# Patient Record
Sex: Female | Born: 1945
Health system: Southern US, Community
[De-identification: ages and names within clinical notes are randomized; demographics above are authoritative.]

## PROBLEM LIST (undated history)

## (undated) DIAGNOSIS — H409 Unspecified glaucoma: Secondary | ICD-10-CM

## (undated) DIAGNOSIS — D229 Melanocytic nevi, unspecified: Secondary | ICD-10-CM

## (undated) HISTORY — PX: FOOT SURGERY: SHX648

## (undated) HISTORY — PX: BREAST EXCISIONAL BIOPSY: SUR124

---
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
## 1998-01-27 ENCOUNTER — Ambulatory Visit (HOSPITAL_COMMUNITY): Admission: RE | Admit: 1998-01-27 | Discharge: 1998-01-27 | Payer: Self-pay | Admitting: Obstetrics and Gynecology

## 1998-10-19 ENCOUNTER — Emergency Department (HOSPITAL_COMMUNITY): Admission: EM | Admit: 1998-10-19 | Discharge: 1998-10-19 | Payer: Self-pay | Admitting: Emergency Medicine

## 1999-02-02 ENCOUNTER — Ambulatory Visit (HOSPITAL_COMMUNITY): Admission: RE | Admit: 1999-02-02 | Discharge: 1999-02-02 | Payer: Self-pay | Admitting: Obstetrics and Gynecology

## 1999-02-16 ENCOUNTER — Other Ambulatory Visit: Admission: RE | Admit: 1999-02-16 | Discharge: 1999-02-16 | Payer: Self-pay | Admitting: Obstetrics and Gynecology

## 2000-02-08 ENCOUNTER — Ambulatory Visit (HOSPITAL_COMMUNITY): Admission: RE | Admit: 2000-02-08 | Discharge: 2000-02-08 | Payer: Self-pay | Admitting: Obstetrics and Gynecology

## 2000-02-08 ENCOUNTER — Encounter: Payer: Self-pay | Admitting: Obstetrics and Gynecology

## 2000-03-15 ENCOUNTER — Other Ambulatory Visit: Admission: RE | Admit: 2000-03-15 | Discharge: 2000-03-15 | Payer: Self-pay | Admitting: Obstetrics and Gynecology

## 2000-05-16 ENCOUNTER — Encounter: Payer: Self-pay | Admitting: Obstetrics and Gynecology

## 2000-05-16 ENCOUNTER — Encounter: Admission: RE | Admit: 2000-05-16 | Discharge: 2000-05-16 | Payer: Self-pay | Admitting: Obstetrics and Gynecology

## 2001-02-13 ENCOUNTER — Encounter: Payer: Self-pay | Admitting: Obstetrics and Gynecology

## 2001-02-13 ENCOUNTER — Ambulatory Visit (HOSPITAL_COMMUNITY): Admission: RE | Admit: 2001-02-13 | Discharge: 2001-02-13 | Payer: Self-pay | Admitting: Obstetrics and Gynecology

## 2001-05-08 ENCOUNTER — Other Ambulatory Visit: Admission: RE | Admit: 2001-05-08 | Discharge: 2001-05-08 | Payer: Self-pay | Admitting: Obstetrics and Gynecology

## 2002-02-19 ENCOUNTER — Encounter: Payer: Self-pay | Admitting: Obstetrics and Gynecology

## 2002-02-19 ENCOUNTER — Ambulatory Visit (HOSPITAL_COMMUNITY): Admission: RE | Admit: 2002-02-19 | Discharge: 2002-02-19 | Payer: Self-pay | Admitting: Obstetrics and Gynecology

## 2002-02-26 ENCOUNTER — Encounter: Admission: RE | Admit: 2002-02-26 | Discharge: 2002-02-26 | Payer: Self-pay | Admitting: Obstetrics and Gynecology

## 2002-02-26 ENCOUNTER — Encounter: Payer: Self-pay | Admitting: Obstetrics and Gynecology

## 2002-08-28 ENCOUNTER — Other Ambulatory Visit: Admission: RE | Admit: 2002-08-28 | Discharge: 2002-08-28 | Payer: Self-pay | Admitting: Obstetrics and Gynecology

## 2003-03-04 ENCOUNTER — Encounter: Payer: Self-pay | Admitting: Obstetrics and Gynecology

## 2003-03-04 ENCOUNTER — Ambulatory Visit (HOSPITAL_COMMUNITY): Admission: RE | Admit: 2003-03-04 | Discharge: 2003-03-04 | Payer: Self-pay | Admitting: Obstetrics and Gynecology

## 2003-09-09 ENCOUNTER — Other Ambulatory Visit: Admission: RE | Admit: 2003-09-09 | Discharge: 2003-09-09 | Payer: Self-pay | Admitting: Obstetrics and Gynecology

## 2004-01-21 ENCOUNTER — Encounter: Admission: RE | Admit: 2004-01-21 | Discharge: 2004-01-21 | Payer: Self-pay | Admitting: Obstetrics and Gynecology

## 2004-05-18 DIAGNOSIS — D229 Melanocytic nevi, unspecified: Secondary | ICD-10-CM

## 2004-05-18 HISTORY — DX: Melanocytic nevi, unspecified: D22.9

## 2004-09-21 ENCOUNTER — Other Ambulatory Visit: Admission: RE | Admit: 2004-09-21 | Discharge: 2004-09-21 | Payer: Self-pay | Admitting: Obstetrics and Gynecology

## 2005-01-18 ENCOUNTER — Ambulatory Visit (HOSPITAL_COMMUNITY): Admission: RE | Admit: 2005-01-18 | Discharge: 2005-01-18 | Payer: Self-pay | Admitting: Obstetrics and Gynecology

## 2005-11-22 ENCOUNTER — Other Ambulatory Visit: Admission: RE | Admit: 2005-11-22 | Discharge: 2005-11-22 | Payer: Self-pay | Admitting: Obstetrics and Gynecology

## 2005-12-07 ENCOUNTER — Encounter: Admission: RE | Admit: 2005-12-07 | Discharge: 2005-12-07 | Payer: Self-pay | Admitting: Obstetrics and Gynecology

## 2006-01-24 ENCOUNTER — Encounter: Admission: RE | Admit: 2006-01-24 | Discharge: 2006-01-24 | Payer: Self-pay | Admitting: Obstetrics and Gynecology

## 2006-03-09 ENCOUNTER — Encounter: Payer: Self-pay | Admitting: Obstetrics and Gynecology

## 2006-12-19 ENCOUNTER — Other Ambulatory Visit: Admission: RE | Admit: 2006-12-19 | Discharge: 2006-12-19 | Payer: Self-pay | Admitting: Obstetrics and Gynecology

## 2007-01-30 ENCOUNTER — Encounter: Admission: RE | Admit: 2007-01-30 | Discharge: 2007-01-30 | Payer: Self-pay | Admitting: Obstetrics and Gynecology

## 2007-12-25 ENCOUNTER — Other Ambulatory Visit: Admission: RE | Admit: 2007-12-25 | Discharge: 2007-12-25 | Payer: Self-pay | Admitting: Obstetrics and Gynecology

## 2008-02-05 ENCOUNTER — Encounter: Admission: RE | Admit: 2008-02-05 | Discharge: 2008-02-05 | Payer: Self-pay | Admitting: Obstetrics and Gynecology

## 2009-01-13 ENCOUNTER — Other Ambulatory Visit: Admission: RE | Admit: 2009-01-13 | Discharge: 2009-01-13 | Payer: Self-pay | Admitting: Obstetrics and Gynecology

## 2009-02-10 ENCOUNTER — Encounter: Admission: RE | Admit: 2009-02-10 | Discharge: 2009-02-10 | Payer: Self-pay | Admitting: Obstetrics and Gynecology

## 2009-12-13 ENCOUNTER — Emergency Department (HOSPITAL_COMMUNITY): Admission: EM | Admit: 2009-12-13 | Discharge: 2009-12-13 | Payer: Self-pay | Admitting: Emergency Medicine

## 2010-01-19 ENCOUNTER — Other Ambulatory Visit: Admission: RE | Admit: 2010-01-19 | Discharge: 2010-01-19 | Payer: Self-pay | Admitting: Obstetrics and Gynecology

## 2010-02-24 ENCOUNTER — Encounter: Admission: RE | Admit: 2010-02-24 | Discharge: 2010-02-24 | Payer: Self-pay | Admitting: Obstetrics and Gynecology

## 2011-01-11 ENCOUNTER — Other Ambulatory Visit: Payer: Self-pay | Admitting: Obstetrics and Gynecology

## 2011-01-11 DIAGNOSIS — Z1231 Encounter for screening mammogram for malignant neoplasm of breast: Secondary | ICD-10-CM

## 2011-03-01 ENCOUNTER — Ambulatory Visit
Admission: RE | Admit: 2011-03-01 | Discharge: 2011-03-01 | Disposition: A | Discharge status: Home / Self Care | Payer: Federal, State, Local not specified - PPO | Source: Ambulatory Visit | Attending: Obstetrics and Gynecology | Admitting: Obstetrics and Gynecology

## 2011-03-01 DIAGNOSIS — Z1231 Encounter for screening mammogram for malignant neoplasm of breast: Secondary | ICD-10-CM

## 2011-03-29 ENCOUNTER — Other Ambulatory Visit: Payer: Self-pay | Admitting: Obstetrics and Gynecology

## 2011-03-29 ENCOUNTER — Other Ambulatory Visit (HOSPITAL_COMMUNITY)
Admission: RE | Admit: 2011-03-29 | Discharge: 2011-03-29 | Disposition: A | Discharge status: Home / Self Care | Payer: Federal, State, Local not specified - PPO | Source: Ambulatory Visit | Attending: Obstetrics and Gynecology | Admitting: Obstetrics and Gynecology

## 2011-03-29 DIAGNOSIS — Z01419 Encounter for gynecological examination (general) (routine) without abnormal findings: Secondary | ICD-10-CM | POA: Insufficient documentation

## 2011-12-27 ENCOUNTER — Other Ambulatory Visit: Payer: Self-pay | Admitting: Obstetrics and Gynecology

## 2011-12-27 DIAGNOSIS — Z1231 Encounter for screening mammogram for malignant neoplasm of breast: Secondary | ICD-10-CM

## 2012-03-06 ENCOUNTER — Ambulatory Visit: Payer: Federal, State, Local not specified - PPO

## 2012-03-06 ENCOUNTER — Ambulatory Visit
Admission: RE | Admit: 2012-03-06 | Discharge: 2012-03-06 | Disposition: A | Discharge status: Home / Self Care | Payer: 59 | Source: Ambulatory Visit | Attending: Obstetrics and Gynecology | Admitting: Obstetrics and Gynecology

## 2012-03-06 DIAGNOSIS — Z1231 Encounter for screening mammogram for malignant neoplasm of breast: Secondary | ICD-10-CM

## 2012-04-04 ENCOUNTER — Other Ambulatory Visit: Payer: Self-pay | Admitting: Obstetrics and Gynecology

## 2012-04-04 ENCOUNTER — Other Ambulatory Visit (HOSPITAL_COMMUNITY)
Admission: RE | Admit: 2012-04-04 | Discharge: 2012-04-04 | Disposition: A | Discharge status: Home / Self Care | Payer: 59 | Source: Ambulatory Visit | Attending: Obstetrics and Gynecology | Admitting: Obstetrics and Gynecology

## 2012-04-04 DIAGNOSIS — Z1159 Encounter for screening for other viral diseases: Secondary | ICD-10-CM | POA: Insufficient documentation

## 2012-04-04 DIAGNOSIS — Z01419 Encounter for gynecological examination (general) (routine) without abnormal findings: Secondary | ICD-10-CM | POA: Insufficient documentation

## 2012-10-11 ENCOUNTER — Encounter (HOSPITAL_COMMUNITY): Payer: Self-pay | Admitting: Emergency Medicine

## 2012-10-11 ENCOUNTER — Inpatient Hospital Stay (HOSPITAL_COMMUNITY)
Admission: EM | Admit: 2012-10-11 | Discharge: 2012-10-13 | DRG: 188 | Disposition: A | Discharge status: Home / Self Care | Payer: Medicare Other | Attending: Internal Medicine | Admitting: Internal Medicine

## 2012-10-11 ENCOUNTER — Emergency Department (HOSPITAL_COMMUNITY): Payer: Medicare Other

## 2012-10-11 DIAGNOSIS — J189 Pneumonia, unspecified organism: Secondary | ICD-10-CM

## 2012-10-11 DIAGNOSIS — R0602 Shortness of breath: Secondary | ICD-10-CM | POA: Diagnosis present

## 2012-10-11 DIAGNOSIS — R079 Chest pain, unspecified: Secondary | ICD-10-CM | POA: Diagnosis present

## 2012-10-11 DIAGNOSIS — R071 Chest pain on breathing: Secondary | ICD-10-CM | POA: Diagnosis present

## 2012-10-11 DIAGNOSIS — J90 Pleural effusion, not elsewhere classified: Principal | ICD-10-CM | POA: Diagnosis present

## 2012-10-11 DIAGNOSIS — R091 Pleurisy: Secondary | ICD-10-CM | POA: Diagnosis present

## 2012-10-11 NOTE — ED Provider Notes (Signed)
History    CSN: [id number] Arrival date & time 10/11/12  1737 First MD Initiated Contact with Patient 10/11/12 2303      Chief Complaint  Patient presents with  . Pneumonia    HPI Pt has been having cough and shortness of breath.  This all started sometime before Nov 18th.  She was diagnosed with a viral abx.  She did not get better so followed up at an urgent care this past saturday.  At that time she was diagonsed with PNA and started avelox.  She still is not feeling any better.  No fevers.  The cough is dry.  She has pain bilateral chest.  Sharp, increases with  Movement and deep breathing.  No leg swelling.  History reviewed. No pertinent past medical history.  History reviewed. No pertinent past surgical history.  History reviewed. No pertinent family history.  History  Substance Use Topics  . Smoking status: Never Smoker   . Smokeless tobacco: Not on file  . Alcohol Use: No    OB History    Grav Para Term Preterm Abortions TAB SAB Ect Mult Living                  Review of Systems  Constitutional: Negative for fever.  Eyes: Negative for photophobia.  Musculoskeletal: Negative for joint swelling.  All other systems reviewed and are negative.    Allergies  Amoxicillin; Biaxin; Codeine; Darvon; Demerol; and Hydrocodone  Home Medications   Current Outpatient Rx  Name  Route  Sig  Dispense  Refill  . CALCIUM CARBONATE ANTACID 500 MG PO CHEW   Oral   Chew 1 tablet by mouth daily.         Marland Kitchen DM-GUAIFENESIN ER 30-600 MG PO TB12   Oral   Take 1 tablet by mouth every 12 (twelve) hours.         . IBUPROFEN 200 MG PO TABS   Oral   Take 400 mg by mouth every 6 (six) hours as needed.         Marland Kitchen LORATADINE 10 MG PO TABS   Oral   Take 10 mg by mouth daily.         Marland Kitchen MOXIFLOXACIN HCL 400 MG PO TABS   Oral   Take 400 mg by mouth daily.         . MULTI-VITAMIN/MINERALS PO TABS   Oral   Take 1 tablet by mouth daily.           BP 150/63  Pulse  94  Temp 98.5 F (36.9 C) (Oral)  Resp 18  SpO2 93%  Physical Exam  Nursing note and vitals reviewed. Constitutional: She appears well-developed and well-nourished. No distress.  HENT:  Head: Normocephalic and atraumatic.  Right Ear: External ear normal.  Left Ear: External ear normal.  Eyes: Conjunctivae normal are normal. Right eye exhibits no discharge. Left eye exhibits no discharge. No scleral icterus.  Neck: Neck supple. No tracheal deviation present.  Cardiovascular: Normal rate, regular rhythm and intact distal pulses.   Pulmonary/Chest: Effort normal and breath sounds normal. No stridor. No respiratory distress. She has no wheezes. She has no rales.  Abdominal: Soft. Bowel sounds are normal. She exhibits no distension. There is no tenderness. There is no rebound and no guarding.  Musculoskeletal: She exhibits no edema and no tenderness.  Neurological: She is alert. She has normal strength. No sensory deficit. Cranial nerve deficit:  no gross defecits noted. She exhibits normal muscle tone.  She displays no seizure activity. Coordination normal.  Skin: Skin is warm and dry. No rash noted.  Psychiatric: She has a normal mood and affect.    ED Course  Procedures (including critical care time)  Labs Reviewed  BASIC METABOLIC PANEL - Abnormal; Notable for the following:    Potassium 3.1 (*)     Glucose, Bld 112 (*)     All other components within normal limits  D-DIMER, QUANTITATIVE - Abnormal; Notable for the following:    D-Dimer, Quant 2.64 (*)     All other components within normal limits  CBC WITH DIFFERENTIAL  PRO B NATRIURETIC PEPTIDE  TROPONIN I   Dg Chest 2 View  10/11/2012  *RADIOLOGY REPORT*  Clinical Data: Shortness of breath.  Pneumonia.  CHEST - 2 VIEW  Comparison: None.  Findings: Small pleural effusions are seen bilaterally.  Low lung volumes are seen with subsegmental atelectasis at both lung bases. No evidence of pulmonary consolidation.  Heart borders are  obscured by the pleural effusions, but not definitely enlarged.  IMPRESSION: Small bilateral pleural effusions and bibasilar subsegmental atelectasis.   Original Report Authenticated By: Myles Rosenthal, M.D.    Ct Angio Chest W/cm &/or Wo Cm  10/12/2012  *RADIOLOGY REPORT*  Clinical Data: Chest pain and shortness of breath.  Elevated D- dimer.  CT ANGIOGRAPHY CHEST  Technique:  Multidetector CT imaging of the chest using the standard protocol during bolus administration of intravenous contrast. Multiplanar reconstructed images including MIPs were obtained and reviewed to evaluate the vascular anatomy.  Contrast: OMNIPAQUE IOHEXOL 350 MG/ML SOLN  Comparison: Chest radiograph performed 10/11/2012  Findings: There is no evidence of pulmonary embolus; evaluation for pulmonary embolus is mildly suboptimal in areas of airspace consolidation.  Mildly loculated small to moderate bilateral pleural effusions are noted, with partial consolidation of both lower lung lobes, more prominent on the right.  The expanded portions of the lungs appear grossly clear.  There is no evidence of pneumothorax.  No masses are identified, though evaluation for masses is suboptimal due to airspace consolidation; no abnormal focal contrast enhancement is seen.  The mediastinum is unremarkable in appearance.  No mediastinal lymphadenopathy is seen.  No pericardial effusion is identified. The great vessels are grossly unremarkable in appearance.  No axillary lymphadenopathy is seen.  The thyroid gland is unremarkable in appearance.  The visualized portions of the liver and spleen are unremarkable.  No acute osseous abnormalities are seen.  IMPRESSION:  1.  No evidence of pulmonary embolus. 2.  Mildly loculated small to moderate bilateral pleural effusions, with partial consolidation of both lower lung lobes, more prominent on the right.  Interstitial edema could have such an appearance, though interstitial prominence is less than would  typically be expected; given clinical concern, pneumonia cannot be excluded.   Original Report Authenticated By: Tonia Ghent, M.D.      1. CAP (community acquired pneumonia)       MDM  Discussed ct scanning.  Pt would prefer to avoid.  Will check d dimer.  D dimer elevated but no PE on CT.  Pleural effusion.  Possible PNA?Marland Kitchen   Will add on bnp and troponin.  Considering her persistent symptoms and decreased 02 sat will admit for iv abx further evaluation.       Celene Kras, MD 10/12/12 [phone]

## 2012-10-11 NOTE — ED Notes (Signed)
Pt had viral bronchitis dx on Nov 18.  Was given abx.  Was dx w/ pneumonia this last Saturday at urgent care.  Still was not feeling better today so she called her doctor who sent her here because it was too late in the day.

## 2012-10-12 ENCOUNTER — Emergency Department (HOSPITAL_COMMUNITY): Payer: Medicare Other

## 2012-10-12 ENCOUNTER — Inpatient Hospital Stay (HOSPITAL_COMMUNITY): Payer: Medicare Other

## 2012-10-12 ENCOUNTER — Encounter (HOSPITAL_COMMUNITY): Payer: Self-pay | Admitting: Internal Medicine

## 2012-10-12 DIAGNOSIS — R6889 Other general symptoms and signs: Secondary | ICD-10-CM

## 2012-10-12 DIAGNOSIS — J189 Pneumonia, unspecified organism: Secondary | ICD-10-CM

## 2012-10-12 DIAGNOSIS — R079 Chest pain, unspecified: Secondary | ICD-10-CM

## 2012-10-12 DIAGNOSIS — J90 Pleural effusion, not elsewhere classified: Principal | ICD-10-CM

## 2012-10-12 DIAGNOSIS — R091 Pleurisy: Secondary | ICD-10-CM | POA: Diagnosis present

## 2012-10-12 DIAGNOSIS — R0602 Shortness of breath: Secondary | ICD-10-CM

## 2012-10-12 LAB — COMPREHENSIVE METABOLIC PANEL
ALT: 13 U/L (ref 0–35)
AST: 17 U/L (ref 0–37)
Albumin: 2.6 g/dL — ABNORMAL LOW (ref 3.5–5.2)
Alkaline Phosphatase: 86 U/L (ref 39–117)
BUN: 13 mg/dL (ref 6–23)
CO2: 25 mEq/L (ref 19–32)
Calcium: 8.6 mg/dL (ref 8.4–10.5)
Chloride: 105 mEq/L (ref 96–112)
Creatinine, Ser: 0.63 mg/dL (ref 0.50–1.10)
GFR calc Af Amer: >90 mL/min (ref >90)
GFR calc non Af Amer: >90 mL/min (ref >90)
Glucose, Bld: 91 mg/dL (ref 70–99)
Potassium: 3.6 mEq/L (ref 3.5–5.1)
Sodium: 140 mEq/L (ref 135–145)
Total Bilirubin: 0.4 mg/dL (ref 0.3–1.2)
Total Protein: 6 g/dL (ref 6.0–8.3)

## 2012-10-12 LAB — CBC WITH DIFFERENTIAL/PLATELET
Basophils Absolute: 0 10*3/uL (ref 0.0–0.1)
Basophils Absolute: 0.1 10*3/uL (ref 0.0–0.1)
Basophils Relative: 1 % (ref 0–1)
Basophils Relative: 1 % (ref 0–1)
Eosinophils Absolute: 0.2 10*3/uL (ref 0.0–0.7)
Eosinophils Absolute: 0.3 10*3/uL (ref 0.0–0.7)
Eosinophils Relative: 2 % (ref 0–5)
Eosinophils Relative: 3 % (ref 0–5)
HCT: 39.8 % (ref 36.0–46.0)
HCT: 41.2 % (ref 36.0–46.0)
Hemoglobin: 13.6 g/dL (ref 12.0–15.0)
Hemoglobin: 14.2 g/dL (ref 12.0–15.0)
Lymphocytes Relative: 20 % (ref 12–46)
Lymphocytes Relative: 23 % (ref 12–46)
Lymphs Abs: 1.5 10*3/uL (ref 0.7–4.0)
Lymphs Abs: 2.3 10*3/uL (ref 0.7–4.0)
MCH: 30.1 pg (ref 26.0–34.0)
MCH: 30.3 pg (ref 26.0–34.0)
MCHC: 34.2 g/dL (ref 30.0–36.0)
MCHC: 34.5 g/dL (ref 30.0–36.0)
MCV: 87.5 fL (ref 78.0–100.0)
MCV: 88.6 fL (ref 78.0–100.0)
Monocytes Absolute: 0.7 10*3/uL (ref 0.1–1.0)
Monocytes Absolute: 1 10*3/uL (ref 0.1–1.0)
Monocytes Relative: 10 % (ref 3–12)
Monocytes Relative: 9 % (ref 3–12)
Neutro Abs: 5.1 10*3/uL (ref 1.7–7.7)
Neutro Abs: 6.3 10*3/uL (ref 1.7–7.7)
Neutrophils Relative %: 64 % (ref 43–77)
Neutrophils Relative %: 67 % (ref 43–77)
Platelets: 258 10*3/uL (ref 150–400)
Platelets: 266 10*3/uL (ref 150–400)
RBC: 4.49 MIL/uL (ref 3.87–5.11)
RBC: 4.71 MIL/uL (ref 3.87–5.11)
RDW: 12.8 % (ref 11.5–15.5)
RDW: 13.1 % (ref 11.5–15.5)
WBC: 7.6 10*3/uL (ref 4.0–10.5)
WBC: 9.9 10*3/uL (ref 4.0–10.5)

## 2012-10-12 LAB — BASIC METABOLIC PANEL WITH GFR
BUN: 15 mg/dL (ref 6–23)
CO2: 26 meq/L (ref 19–32)
Calcium: 8.5 mg/dL (ref 8.4–10.5)
Chloride: 102 meq/L (ref 96–112)
Creatinine, Ser: 0.63 mg/dL (ref 0.50–1.10)
GFR calc Af Amer: >90 mL/min
GFR calc non Af Amer: >90 mL/min
Glucose, Bld: 112 mg/dL — ABNORMAL HIGH (ref 70–99)
Potassium: 3.1 meq/L — ABNORMAL LOW (ref 3.5–5.1)
Sodium: 137 meq/L (ref 135–145)

## 2012-10-12 LAB — BODY FLUID CELL COUNT WITH DIFFERENTIAL
Eos, Fluid: 11 %
Lymphs, Fluid: 47 %
Monocyte-Macrophage-Serous Fluid: 10 % — ABNORMAL LOW (ref 50–90)
Neutrophil Count, Fluid: 32 % — ABNORMAL HIGH (ref 0–25)
Total Nucleated Cell Count, Fluid: 2425 cu mm — ABNORMAL HIGH (ref 0–1000)

## 2012-10-12 LAB — PROTEIN, BODY FLUID: Total protein, fluid: 3.9 g/dL

## 2012-10-12 LAB — LACTATE DEHYDROGENASE, PLEURAL OR PERITONEAL FLUID: LD, Fluid: 310 U/L — ABNORMAL HIGH (ref 3–23)

## 2012-10-12 LAB — PRO B NATRIURETIC PEPTIDE: Pro B Natriuretic peptide (BNP): 34 pg/mL (ref 0–125)

## 2012-10-12 LAB — D-DIMER, QUANTITATIVE: D-Dimer, Quant: 2.64 ug{FEU}/mL — ABNORMAL HIGH (ref 0.00–0.48)

## 2012-10-12 LAB — SEDIMENTATION RATE: Sed Rate: 70 mm/hr — ABNORMAL HIGH (ref 0–22)

## 2012-10-12 LAB — TROPONIN I: Troponin I: <0.3 ng/mL

## 2012-10-12 MED ORDER — METHYLPREDNISOLONE SODIUM SUCC 125 MG IJ SOLR
80.0000 mg | Freq: Once | INTRAMUSCULAR | Status: AC
  Administered 2012-10-12: 80 mg via INTRAVENOUS
  Filled 2012-10-12: qty 1.28

## 2012-10-12 MED ORDER — IBUPROFEN 400 MG PO TABS
400.0000 mg | ORAL_TABLET | Freq: Four times a day (QID) | ORAL | Status: DC
  Administered 2012-10-12 – 2012-10-13 (×4): 400 mg via ORAL
  Filled 2012-10-12 (×9): qty 1

## 2012-10-12 MED ORDER — SODIUM CHLORIDE 0.9 % IJ SOLN: 3.0000 mL | Freq: Two times a day (BID) | INTRAMUSCULAR | Status: DC

## 2012-10-12 MED ORDER — ONDANSETRON HCL 4 MG PO TABS: 4.0000 mg | ORAL_TABLET | Freq: Four times a day (QID) | ORAL | Status: DC | PRN

## 2012-10-12 MED ORDER — ACETAMINOPHEN 650 MG RE SUPP: 650.0000 mg | Freq: Four times a day (QID) | RECTAL | Status: DC | PRN

## 2012-10-12 MED ORDER — PREDNISONE 50 MG PO TABS
60.0000 mg | ORAL_TABLET | Freq: Every day | ORAL | Status: DC
  Administered 2012-10-13: 60 mg via ORAL
  Filled 2012-10-12 (×3): qty 1

## 2012-10-12 MED ORDER — LEVOFLOXACIN IN D5W 750 MG/150ML IV SOLN
750.0000 mg | INTRAVENOUS | Status: DC
  Administered 2012-10-12 – 2012-10-13 (×2): 750 mg via INTRAVENOUS
  Filled 2012-10-12 (×2): qty 150

## 2012-10-12 MED ORDER — VANCOMYCIN HCL 500 MG IV SOLR
500.0000 mg | Freq: Two times a day (BID) | INTRAVENOUS | Status: DC
  Administered 2012-10-12: 500 mg via INTRAVENOUS
  Filled 2012-10-12 (×2): qty 500

## 2012-10-12 MED ORDER — DIPHENHYDRAMINE HCL 50 MG/ML IJ SOLN
25.0000 mg | Freq: Once | INTRAMUSCULAR | Status: AC
  Administered 2012-10-12: 03:00:00 via INTRAVENOUS
  Filled 2012-10-12: qty 1

## 2012-10-12 MED ORDER — SODIUM CHLORIDE 0.9 % IV SOLN
INTRAVENOUS | Status: DC
  Administered 2012-10-12: 20 mL/h via INTRAVENOUS
  Administered 2012-10-13: 04:00:00 via INTRAVENOUS

## 2012-10-12 MED ORDER — ACETAMINOPHEN 325 MG PO TABS: 650.0000 mg | ORAL_TABLET | Freq: Four times a day (QID) | ORAL | Status: DC | PRN

## 2012-10-12 MED ORDER — IOHEXOL 350 MG/ML SOLN
100.0000 mL | Freq: Once | INTRAVENOUS | Status: AC | PRN
  Administered 2012-10-12: 100 mL via INTRAVENOUS

## 2012-10-12 MED ORDER — ONDANSETRON HCL 4 MG/2ML IJ SOLN: 4.0000 mg | Freq: Four times a day (QID) | INTRAMUSCULAR | Status: DC | PRN

## 2012-10-12 NOTE — ED Notes (Addendum)
EKG ordered per [name], MD, Hospitalist. EKG in pt. Chart.

## 2012-10-12 NOTE — Progress Notes (Signed)
Patient states that she has some mild pain from thoracentesis site, but shortness of breath is improved.  Caucasian female, no acute distress.  Course rales over right base, no wheezes or rhonchi. -  Continue current therapy

## 2012-10-12 NOTE — ED Notes (Signed)
Pt. Placed on 2 liters of oxygen.

## 2012-10-12 NOTE — Consult Note (Addendum)
PULMONARY  / CRITICAL CARE MEDICINE  Name: Ashley Gallegos MRN: [id number] DOB: 02-22-1946    LOS: 1  REFERRING PROVIDER:  [name]   CHIEF COMPLAINT:   Pleural effusion   BRIEF PATIENT DESCRIPTION:  66 year old female admitted w/ Dyspnea and CP on 12/5. Found to have bilateral pleural effusion. PCCM asked to eval.   SIGNIFICANT EVENTS:  CT chest: No evidence of pulmonary embolus. 2.  Mildly loculated small to moderate bilateral pleural effusions, with partial consolidation of both lower lung lobes, more prominent on the right.  Interstitial edema could have such an appearance, though interstitial prominence is less than would typically be expected; given clinical concern, pneumonia cannot be excluded.   Original Report Authenticated By: Tonia Ghent, M.D.     HISTORY OF PRESENT ILLNESS:   66 year old female who was admitted on 12/5 w/ weakness, bilateral pleuritic type CP and shortness of breath. Reported that  Symptoms started about 2 weeks prior to presentation w/ cough. She called her primary care physician. At that time was felt to be viral. Was instructed to go to the urgent care if symptoms worsen and patient had gone to the urgent care 2 days later and was given a course of oral steroids for a week as patient was found to be wheezing. After the steroid course was over patient started having chest pain or shortness of breath had got urgent care again and at this point was given Avelox. Patient states chest x-ray done at that time showed features concerning for pneumonia. On 12/4 patient became weak and had increasing pain on both the sides of the chest and shortness of breath. Chest pain is pleuritic in nature. Patient did not have any fever chills to have mild productive cough with whitish sputum. In the ER patient had a CT angiogram of the chest which shows bilateral pleural effusion with loculation. Patient has been admitted for further management. PCCM asked to eval in setting of  effusion.   PAST MEDICAL HISTORY :  History reviewed. No pertinent past medical history. Past Surgical History  Procedure Date  . Foot surgery    Prior to Admission medications   Medication Sig Start Date End Date Taking? Authorizing Provider  calcium carbonate (TUMS - DOSED IN MG ELEMENTAL CALCIUM) 500 MG chewable tablet Chew 1 tablet by mouth daily.   Yes Historical Provider, MD  dextromethorphan-guaiFENesin (MUCINEX DM) 30-600 MG per 12 hr tablet Take 1 tablet by mouth every 12 (twelve) hours.   Yes Historical Provider, MD  ibuprofen (ADVIL,MOTRIN) 200 MG tablet Take 400 mg by mouth every 6 (six) hours as needed.   Yes Historical Provider, MD  loratadine (CLARITIN) 10 MG tablet Take 10 mg by mouth daily.   Yes Historical Provider, MD  moxifloxacin (AVELOX) 400 MG tablet Take 400 mg by mouth daily.   Yes Historical Provider, MD  Multiple Vitamins-Minerals (MULTIVITAMIN WITH MINERALS) tablet Take 1 tablet by mouth daily.   Yes Historical Provider, MD   Allergies  Allergen Reactions  . Omnipaque (Iohexol) Hives  . Amoxicillin   . Biaxin (Clarithromycin) Other (See Comments)    UNKNOWN  . Codeine Other (See Comments)    UNKNOWN  . Darvon (Propoxyphene Hcl) Other (See Comments)    UNKNOWN  . Demerol (Meperidine) Other (See Comments)    UNKNOWN  . Hydrocodone Other (See Comments)    UNKNOWN    FAMILY HISTORY:  Family History  Problem Relation Age of Onset  . Breast cancer Mother  SOCIAL HISTORY:  reports that she has never smoked. She has never used smokeless tobacco. She reports that she does not drink alcohol or use illicit drugs.  REVIEW OF SYSTEMS:   Constitutional: Negative for fever, chills, weight loss, malaise/fatigue and diaphoresis.  HENT: Negative for hearing loss, ear pain, nosebleeds, congestion, sore throat, neck pain, tinnitus and ear discharge.   Eyes: Negative for blurred vision, double vision, photophobia, pain, discharge and redness.  Respiratory:  Negative for productive cough, hemoptysis,- sputum production, +shortness of breath, wheezing and stridor.   Cardiovascular:pleuritic CP R>L, - palpitations,- orthopnea, claudication, leg swelling and PND.  Gastrointestinal: Negative for heartburn, nausea, vomiting, abdominal pain, diarrhea, constipation, blood in stool and melena.  Genitourinary: Negative for dysuria, urgency, frequency, hematuria and flank pain.  Musculoskeletal: Negative for myalgias, back pain, joint pain and falls.  Skin: Negative for itching and rash.  Neurological: Negative for dizziness, tingling, tremors, sensory change, speech change, focal weakness, seizures, loss of consciousness, weakness and headaches.  Endo/Heme/Allergies: Negative for environmental allergies and polydipsia. Does not bruise/bleed easily.  INTERVAL HISTORY:   VITAL SIGNS: Temp:  [97.6 F (36.4 C)-98.7 F (37.1 C)] 98.1 F (36.7 C) (12/05 0918) Pulse Rate:  [80-114] 87  (12/05 0918) Resp:  [16-23] 23  (12/05 0918) BP: (42-170)/(25-78) 123/73 mmHg (12/05 0918) SpO2:  [92 %-96 %] 96 % (12/05 0918) Weight:  [58.3 kg (128 lb 8.5 oz)] 58.3 kg (128 lb 8.5 oz) (12/05 1610)  PHYSICAL EXAMINATION: General:  Well appearing white female, no distress Neuro:  Awake, and oriented  HEENT:  [place], no JVD Cardiovascular: rrr Lungs: dull apporx 1/3 up bilaterally, decreased in bases, right friction rub Abdomen:  Soft, non-tender  Musculoskeletal:  Intact  Skin:  Intact    Lab 10/12/12 1005 10/12/12  NA 140 137  K 3.6 3.1*  CL 105 102  CO2 25 26  BUN 13 15  CREATININE 0.63 0.63  GLUCOSE 91 112*    Lab 10/12/12 1005 10/12/12  HGB 14.2 13.6  HCT 41.2 39.8  WBC 7.6 9.9  PLT 266 258   CT chest: bilateral pleural effusions. Right greater than left w/ loculating appearance on right, bilateral compressive atx.   ASSESSMENT: Bilateral pleural effusions due to pleuritis, NOS.  Compressive ATX Diff dx wide: doubt bacterial/parapneumonic, doubt  malignant, most likely viral, possible CTDz.   This is not CHF - normal BNP and finding of friction rub eliminate this as a possibility.    Plan: Right diagnostic and therapeutic thoracentesis today  Analgesia Empiric NSAIDs and prednisone D/C Vanc Cont Levoflox for now but anticipate D/C after review of pleural fluid studies   Billy Fischer, MD ; [facility] service Mobile [phone].  After 5:30 PM or weekends, call [phone]   10/12/2012, 12:40 PM

## 2012-10-12 NOTE — Progress Notes (Signed)
   CARE MANAGEMENT NOTE 10/12/2012  Patient:  Ashley Gallegos, Ashley Gallegos   Account Number:  [id number]  Date Initiated:  10/12/2012  Documentation initiated by:  Jiles Crocker  Subjective/Objective Assessment:   Shortness of breath with pleuritic chest pain and loculated pleural effusion     Action/Plan:   PCP - Dr. Farris Has at [facility].  LIVES AT HOME WITH SPOUSE   Anticipated DC Date:  10/19/2012   Anticipated DC Plan:  HOME/SELF CARE      DC Planning Services  CM consult          Status of service:  In process, will continue to follow Medicare Important Message given?  NA - LOS <3 / Initial given by admissions (If response is "NO", the following Medicare IM given date fields will be blank)  Per UR Regulation:  Reviewed for med. necessity/level of care/duration of stay  Comments:  10/12/2012-B Kamaryn Grimley RN,BSN,MHA

## 2012-10-12 NOTE — Progress Notes (Addendum)
ANTIBIOTIC CONSULT NOTE - INITIAL  Pharmacy Consult for vancomycin/Levaquin Indication: pneumonia  Allergies  Allergen Reactions  . Omnipaque (Iohexol) Hives  . Amoxicillin   . Biaxin (Clarithromycin) Other (See Comments)    UNKNOWN  . Codeine Other (See Comments)    UNKNOWN  . Darvon (Propoxyphene Hcl) Other (See Comments)    UNKNOWN  . Demerol (Meperidine) Other (See Comments)    UNKNOWN  . Hydrocodone Other (See Comments)    UNKNOWN    Patient Measurements: Height: 5\' 1"  (154.9 cm) Weight: 128 lb 8.5 oz (58.3 kg) IBW/kg (Calculated) : 47.8   Vital Signs: Temp: 98.5 F (36.9 C) (12/05 0700) Temp src: Oral (12/05 0700) BP: 125/75 mmHg (12/05 0830) Pulse Rate: 90  (12/05 0830) Intake/Output from previous day:   Intake/Output from this shift:    Labs:  Basename 10/12/12  WBC 9.9  HGB 13.6  PLT 258  LABCREA --  CREATININE 0.63   Estimated Creatinine Clearance: 57.6 ml/min (by C-G formula based on Cr of 0.63). No results found for this basename: VANCOTROUGH:2,VANCOPEAK:2,VANCORANDOM:2,GENTTROUGH:2,GENTPEAK:2,GENTRANDOM:2,TOBRATROUGH:2,TOBRAPEAK:2,TOBRARND:2,AMIKACINPEAK:2,AMIKACINTROU:2,AMIKACIN:2, in the last 72 hours   Microbiology: No results found for this or any previous visit (from the past 720 hour(s)).  Medical History: History reviewed. No pertinent past medical history.  Medications:  Scheduled:    . [COMPLETED] diphenhydrAMINE  25 mg Intravenous Once  . levofloxacin  750 mg Intravenous Q24H  . sodium chloride  3 mL Intravenous Q12H   Infusions:    . sodium chloride     Assessment: 66 yo female admitted with suspected PNA after outpatient failure of Avelox to start vancomycin and Levaquin per pharmacy dosing  Goal of Therapy:  Vancomycin trough level 15-20 mcg/ml  Plan:  1) Based on current weight and renal function, will start vancomycin 500mg  IV q12 2) Continue Levaquin 750mg  IV q24 for CrCl > 50 ml/min as already ordered 3) Vanc  trough as appropriate 4) Monitor renal function   Hessie Knows, PharmD, BCPS Pager [phone] 10/12/2012 10:14 AM

## 2012-10-12 NOTE — H&P (Signed)
Ashley Gallegos is an 66 y.o. female.   Patient was seen and examined on October 12, 2012. PCP - Dr. Farris Has at [facility]. Chief Complaint: Shortness of breath and chest pain. HPI: 66 year-old female with no significant past medical history started spacing cough 2 weeks ago and had called her primary care physician. At that time was felt to be viral. Was instructed to go to the urgent care if symptoms worsen and patient had gone to the urgent care 2 days later and was given a course of oral steroids for a week as patient was found to be wheezing. After the steroid course was over patient started having chest pain or shortness of breath had got urgent care again and at this point was given Avelox. Patient states chest x-ray done at that time showed features concerning for pneumonia. Yesterday patient became weak and had increasing pain on both the sides of the chest and shortness of breath. Chest pain is pleuritic in nature. Patient did not have any fever chills to have mild productive cough with whitish sputum. In the ER patient had a CT angiogram of the chest which shows bilateral pleural effusion with loculation. Patient has been admitted for further management.  History reviewed. No pertinent past medical history.  Past Surgical History  Procedure Date  . Foot surgery     Family History  Problem Relation Age of Onset  . Breast cancer Mother    Social History:  reports that she has never smoked. She does not have any smokeless tobacco history on file. She reports that she does not drink alcohol or use illicit drugs.  Allergies:  Allergies  Allergen Reactions  . Omnipaque (Iohexol) Hives  . Amoxicillin   . Biaxin (Clarithromycin) Other (See Comments)    UNKNOWN  . Codeine Other (See Comments)    UNKNOWN  . Darvon (Propoxyphene Hcl) Other (See Comments)    UNKNOWN  . Demerol (Meperidine) Other (See Comments)    UNKNOWN  . Hydrocodone Other (See Comments)    UNKNOWN      (Not in a hospital admission)  Results for orders placed during the hospital encounter of 10/11/12 (from the past 48 hour(s))  CBC WITH DIFFERENTIAL     Status: Normal   Collection Time   10/12/12 12:00 AM      Component Value Range Comment   WBC 9.9  4.0 - 10.5 K/uL    RBC 4.49  3.87 - 5.11 MIL/uL    Hemoglobin 13.6  12.0 - 15.0 g/dL    HCT 95.6  21.3 - 08.6 %    MCV 88.6  78.0 - 100.0 fL    MCH 30.3  26.0 - 34.0 pg    MCHC 34.2  30.0 - 36.0 g/dL    RDW 57.8  46.9 - 62.9 %    Platelets 258  150 - 400 K/uL    Neutrophils Relative 64  43 - 77 %    Neutro Abs 6.3  1.7 - 7.7 K/uL    Lymphocytes Relative 23  12 - 46 %    Lymphs Abs 2.3  0.7 - 4.0 K/uL    Monocytes Relative 10  3 - 12 %    Monocytes Absolute 1.0  0.1 - 1.0 K/uL    Eosinophils Relative 3  0 - 5 %    Eosinophils Absolute 0.3  0.0 - 0.7 K/uL    Basophils Relative 1  0 - 1 %    Basophils Absolute 0.1  0.0 - 0.1 K/uL   BASIC METABOLIC PANEL     Status: Abnormal   Collection Time   10/12/12 12:00 AM      Component Value Range Comment   Sodium 137  135 - 145 mEq/L    Potassium 3.1 (*) 3.5 - 5.1 mEq/L    Chloride 102  96 - 112 mEq/L    CO2 26  19 - 32 mEq/L    Glucose, Bld 112 (*) 70 - 99 mg/dL    BUN 15  6 - 23 mg/dL    Creatinine, Ser 1.61  0.50 - 1.10 mg/dL    Calcium 8.5  8.4 - 09.6 mg/dL    GFR calc non Af Amer >90  >90 mL/min    GFR calc Af Amer >90  >90 mL/min   D-DIMER, QUANTITATIVE     Status: Abnormal   Collection Time   10/12/12 12:00 AM      Component Value Range Comment   D-Dimer, Quant 2.64 (*) 0.00 - 0.48 ug/mL-FEU    Dg Chest 2 View  10/11/2012  *RADIOLOGY REPORT*  Clinical Data: Shortness of breath.  Pneumonia.  CHEST - 2 VIEW  Comparison: None.  Findings: Small pleural effusions are seen bilaterally.  Low lung volumes are seen with subsegmental atelectasis at both lung bases. No evidence of pulmonary consolidation.  Heart borders are obscured by the pleural effusions, but not definitely  enlarged.  IMPRESSION: Small bilateral pleural effusions and bibasilar subsegmental atelectasis.   Original Report Authenticated By: Myles Rosenthal, M.D.    Ct Angio Chest W/cm &/or Wo Cm  10/12/2012  *RADIOLOGY REPORT*  Clinical Data: Chest pain and shortness of breath.  Elevated D- dimer.  CT ANGIOGRAPHY CHEST  Technique:  Multidetector CT imaging of the chest using the standard protocol during bolus administration of intravenous contrast. Multiplanar reconstructed images including MIPs were obtained and reviewed to evaluate the vascular anatomy.  Contrast: OMNIPAQUE IOHEXOL 350 MG/ML SOLN  Comparison: Chest radiograph performed 10/11/2012  Findings: There is no evidence of pulmonary embolus; evaluation for pulmonary embolus is mildly suboptimal in areas of airspace consolidation.  Mildly loculated small to moderate bilateral pleural effusions are noted, with partial consolidation of both lower lung lobes, more prominent on the right.  The expanded portions of the lungs appear grossly clear.  There is no evidence of pneumothorax.  No masses are identified, though evaluation for masses is suboptimal due to airspace consolidation; no abnormal focal contrast enhancement is seen.  The mediastinum is unremarkable in appearance.  No mediastinal lymphadenopathy is seen.  No pericardial effusion is identified. The great vessels are grossly unremarkable in appearance.  No axillary lymphadenopathy is seen.  The thyroid gland is unremarkable in appearance.  The visualized portions of the liver and spleen are unremarkable.  No acute osseous abnormalities are seen.  IMPRESSION:  1.  No evidence of pulmonary embolus. 2.  Mildly loculated small to moderate bilateral pleural effusions, with partial consolidation of both lower lung lobes, more prominent on the right.  Interstitial edema could have such an appearance, though interstitial prominence is less than would typically be expected; given clinical concern, pneumonia  cannot be excluded.   Original Report Authenticated By: Tonia Ghent, M.D.     Review of Systems  Constitutional: Negative.   HENT: Negative.   Eyes: Negative.   Respiratory: Positive for cough and shortness of breath.   Cardiovascular: Positive for chest pain.  Gastrointestinal: Negative.   Genitourinary: Negative.   Musculoskeletal: Negative.  Skin: Negative.   Neurological: Negative.   Endo/Heme/Allergies: Negative.   Psychiatric/Behavioral: Negative.     Blood pressure 170/69, pulse 114, temperature 98.1 F (36.7 C), temperature source Oral, resp. rate 16, SpO2 92.00%. Physical Exam  Constitutional: She is oriented to person, place, and time. She appears well-developed and well-nourished. No distress.  HENT:  Head: Normocephalic and atraumatic.  Right Ear: External ear normal.  Left Ear: External ear normal.  Nose: Nose normal.  Mouth/Throat: Oropharynx is clear and moist. No oropharyngeal exudate.  Eyes: Conjunctivae normal are normal. Pupils are equal, round, and reactive to light. Right eye exhibits no discharge. Left eye exhibits no discharge. No scleral icterus.  Neck: Normal range of motion. Neck supple.  Cardiovascular: Normal rate and regular rhythm.   Respiratory: Effort normal and breath sounds normal. No respiratory distress. She has no wheezes. She has no rales.  GI: Soft. Bowel sounds are normal. She exhibits no distension. There is no tenderness. There is no rebound.  Musculoskeletal: She exhibits no edema and no tenderness.  Neurological: She is alert and oriented to person, place, and time.       Moves all extremities.  Skin: Skin is warm and dry. She is not diaphoretic.     Assessment/Plan #1. Shortness of breath with pleuritic chest pain and loculated pleural effusion concerning for pneumonia with parapneumonic effusion - patient has been placed on vancomycin and Levaquin. We'll consult pulmonary for further recommendation. EKG shows sinus rhythm.  Cardiac enzymes and BNP are pending.  CODE STATUS - full code.  Rishi Vicario N. 10/12/2012, 5:04 AM

## 2012-10-12 NOTE — Procedures (Signed)
Thoracentesis Procedure Note  Pre-operative Diagnosis: bilateral pleural effusions   Post-operative Diagnosis: same  Indications: pleuritis  Procedure Details  Consent: Informed consent was obtained. Risks of the procedure were discussed including: infection, bleeding, pain, pneumothorax.  Under sterile conditions the patient was positioned. Betadine solution and sterile drapes were utilized.  1% buffered lidocaine was used to anesthetize the 8th rib space. Fluid was obtained without any difficulties and minimal blood loss.  A dressing was applied to the wound and wound care instructions were provided.   Findings 400 ml of cloudy yellow pleural fluid was obtained. A sample was sent to Pathology for cytogenetics, flow, and cell counts, as well as for infection analysis.  Complications:  None; patient tolerated the procedure well.          Condition: stable  Plan A follow up chest x-ray was ordered. Bed Rest for 1 hours. Tylenol 650 mg. for pain.  Attending Attestation: I performed the procedure.  Billy Fischer, MD ; [facility] [phone].  After 5:30 PM or weekends, call [phone]

## 2012-10-13 ENCOUNTER — Inpatient Hospital Stay (HOSPITAL_COMMUNITY): Payer: Medicare Other

## 2012-10-13 DIAGNOSIS — R0602 Shortness of breath: Secondary | ICD-10-CM

## 2012-10-13 DIAGNOSIS — R091 Pleurisy: Secondary | ICD-10-CM

## 2012-10-13 LAB — PATHOLOGIST SMEAR REVIEW

## 2012-10-13 LAB — ANA: Anti Nuclear Antibody(ANA): NEGATIVE

## 2012-10-13 LAB — RHEUMATOID FACTOR: Rhuematoid fact SerPl-aCnc: <10 IU/mL (ref <=14)

## 2012-10-13 MED ORDER — IBUPROFEN 400 MG PO TABS: 400.0000 mg | ORAL_TABLET | Freq: Four times a day (QID) | ORAL | Status: DC

## 2012-10-13 MED ORDER — OMEPRAZOLE 40 MG PO CPDR: 40.0000 mg | DELAYED_RELEASE_CAPSULE | Freq: Every day | ORAL | Status: DC

## 2012-10-13 MED ORDER — PREDNISONE 10 MG PO TABS: ORAL_TABLET | ORAL | Status: DC

## 2012-10-13 NOTE — Consult Note (Signed)
PULMONARY  / CRITICAL CARE MEDICINE  Name: Ashley Gallegos MRN: [id number] DOB: 11-10-1945    LOS: 2  REFERRING PROVIDER:  [name]   CHIEF COMPLAINT:   Pleural effusion   BRIEF PATIENT DESCRIPTION:  66 year old female admitted w/ Dyspnea and CP on 12/5. Found to have bilateral pleural effusion. PCCM asked to eval.   SIGNIFICANT EVENTS:  CT chest: No evidence of pulmonary embolus. 2.  Mildly loculated small to moderate bilateral pleural effusions, with partial consolidation of both lower lung lobes, more prominent on the right.  Interstitial edema could have such an appearance, though interstitial prominence is less than would typically be expected; given clinical concern, pneumonia cannot be excluded.   Original Report Authenticated By: Tonia Ghent, M.D.    Overnight events/ subjective: One episode of severe cough, resolved, no pleuritic cp    VITAL SIGNS: Temp:  [97.6 F (36.4 C)-98.5 F (36.9 C)] 97.7 F (36.5 C) (12/06 1425) Pulse Rate:  [78-99] 93  (12/06 1425) Resp:  [18-20] 20  (12/06 1425) BP: (120-159)/(63-74) 159/70 mmHg (12/06 1425) SpO2:  [91 %-94 %] 94 % (12/06 1425) FIO2 RA  PHYSICAL EXAMINATION: General:  Well appearing white female, no distress Neuro:  Awake, and oriented  HEENT:  [place], no JVD Cardiovascular: rrr Lungs: dull apporx 1/3 up bilaterally, decreased in bases, right friction rub Abdomen:  Soft, non-tender  Musculoskeletal:  Intact  Skin:  Intact    Lab 10/12/12 1005 10/12/12  NA 140 137  K 3.6 3.1*  CL 105 102  CO2 25 26  BUN 13 15  CREATININE 0.63 0.63  GLUCOSE 91 112*    Lab 10/12/12 1005 10/12/12  HGB 14.2 13.6  HCT 41.2 39.8  WBC 7.6 9.9  PLT 266 258     ASSESSMENT: Bilateral pleural effusions due to pleuritis, NOS.  Compressive ATX - s/p R thoracentesis 12/5 x 400 cc exudate, lymph predominant with inflammatory cytology -ESR 70  This is typical of an inflammatory /pleuritis but no specific dx c/w viral or undeclared  collagen vascular process > rec taper off predisone x 2 week course then f/u with Dr Kevan Ny or by me in pulmonary clinic  Sandrea Hughs, MD Pulmonary and Critical Care Medicine [facility] Cell [phone]          10/13/2012, 3:26 PM

## 2012-10-13 NOTE — Discharge Summary (Addendum)
Physician Discharge Summary  Ashley Gallegos ZOX:[id number] DOB: 1946-06-29 DOA: 10/11/2012  PCP: No primary provider on file.  Admit date: 10/11/2012 Discharge date: 10/13/2012  Recommendations for Outpatient Follow-up:  Continue ibuprofen, prednisone taper over 2 weeks and follow up with Pulmonary in 2 weeks.  Consider repeat CXR at that time.  The following labs are pending at discharge:  Body fluid culture from 12/5, cytology from 12/5.    Discharge Diagnoses:  Principal Problem:  *SOB (shortness of breath) Active Problems:  Chest pain  Pleuritis   Discharge Condition: stable, improved  Diet recommendation:  regular  Wt Readings from Last 3 Encounters:  10/12/12 58.3 kg (128 lb 8.5 oz)    History of present illness:   66 year-old female with no significant past medical history started spacing cough 2 weeks ago and had called her primary care physician. At that time was felt to be viral. Was instructed to go to the urgent care if symptoms worsen and patient had gone to the urgent care 2 days later and was given a course of oral steroids for a week as patient was found to be wheezing. After the steroid course was over patient started having chest pain or shortness of breath had got urgent care again and at this point was given Avelox. Patient states chest x-ray done at that time showed features concerning for pneumonia. Yesterday patient became weak and had increasing pain on both the sides of the chest and shortness of breath. Chest pain is pleuritic in nature. Patient did not have any fever chills to have mild productive cough with whitish sputum. In the ER patient had a CT angiogram of the chest which shows bilateral pleural effusion with loculation. Patient has been admitted for further management.   Hospital Course:   Ashley Gallegos was started on steroids, NSAIDS, and levofloxacin.  Pulmonary critical care was consulted who felt that her symptoms reflected pleuritis and less likely  infection or CHF.  Patient underwent thoracentesis on 12/5 and of cloudy yellow fluid was removed.  She had a mildly elevated WBC, neutrophil predominance, negative gram stain of the fluid.  Pathology demonstrated acute inflammation with reactive mesothelial cells.  Protein was mildly elevated.  Cytology and culture are pending.  Her RF was negative and her ANA is negative.  Her ESR is 70.  She felt better after thoracentesis and was able to breath on room air.  She had some mild pain which is resolved.  She had a mild increase in cough this morning, but repeat CXR was stable.    Procedures:  Thoracentesis 12/5, right  Consultations:  Pulmonary critical care  Discharge Exam: Filed Vitals:   10/13/12 1425  BP: 159/70  Pulse: 93  Temp: 97.7 F (36.5 C)  Resp: 20   Filed Vitals:   10/12/12 2126 10/13/12 0329 10/13/12 0548 10/13/12 1425  BP: 151/74 135/63 120/74 159/70  Pulse: 99 78 81 93  Temp: 98.5 F (36.9 C) 97.6 F (36.4 C) 97.7 F (36.5 C) 97.7 F (36.5 C)  TempSrc: Oral Oral Oral Oral  Resp: 20 18 18 20   Height:      Weight:      SpO2: 93% 91% 93% 94%    General: Caucasian female, no acute distress,  HEENT:  MMM Cardiovascular: RRR, no murmurs, rubs, or gallops Respiratory: Course rales at the right base greater than left base, no wheezes or rhonchi Abd:  NABS, soft, nondistended, nontender, no organomegaly MSK:  No LEE  Discharge Instructions  Discharge Orders    Future Orders Please Complete By Expires   Diet general      Increase activity slowly      Discharge instructions      Comments:   You were hospitalized with difficulty breathing and chest pain and were found to have a pleural effusion, or fluid around your lung.  You underwent thoracentesis to drain the fluid and it does not appear to have bacterial infection, although the culture of the fluid is still pending.  Please stop your antibiotics because you have completed 7 days of treatment  today.  Please continue prednisone as a taper.  Take 5 tabs day for two days starting tomorrow, then 4 tabs daily for 2 days, then 3 tabs daily for two days, then 2 tabs daily for two days, then 1 tab daily for two days, then stop.  Please also take ibuprofen 400mg  four times a day for the next two weeks.  Please take omeprazole to prevent stomach ulcers while taking prednisone and ibuprofen.  Please follow up in the pulmonology clinic in 2 weeks.  Call the number provided.   Call MD for:  temperature >100.4      Call MD for:  persistant nausea and vomiting      Call MD for:  severe uncontrolled pain      Call MD for:  redness, tenderness, or signs of infection (pain, swelling, redness, odor or green/yellow discharge around incision site)      Call MD for:  difficulty breathing, headache or visual disturbances      Call MD for:  hives      Call MD for:  persistant dizziness or light-headedness      Call MD for:  extreme fatigue          Medication List     As of 10/13/2012  4:08 PM    STOP taking these medications         moxifloxacin 400 MG tablet   Commonly known as: AVELOX      TAKE these medications         calcium carbonate 500 MG chewable tablet   Commonly known as: TUMS - dosed in mg elemental calcium   Chew 1 tablet by mouth daily.      dextromethorphan-guaiFENesin 30-600 MG per 12 hr tablet   Commonly known as: MUCINEX DM   Take 1 tablet by mouth every 12 (twelve) hours.      ibuprofen 400 MG tablet   Commonly known as: ADVIL,MOTRIN   Take 1 tablet (400 mg total) by mouth 4 (four) times daily.      loratadine 10 MG tablet   Commonly known as: CLARITIN   Take 10 mg by mouth daily.      multivitamin with minerals tablet   Take 1 tablet by mouth daily.      omeprazole 40 MG capsule   Commonly known as: PRILOSEC   Take 1 capsule (40 mg total) by mouth daily.      predniSONE 10 MG tablet   Commonly known as: DELTASONE   5 tabs daily for 2 days, then 4 tabs daily x  2 days, then 3 tabs x 2 days, then 2 tabs for 2 days, then 1 tab for 2 days, then stop.         Follow-up Information    Follow up with Sandrea Hughs, MD. Schedule an appointment as soon as possible for a visit in 2 weeks.   Contact information:  [address] [place] [address] [phone]       Schedule an appointment as soon as possible for a visit with Primary care doctor. (As needed)           The results of significant diagnostics from this hospitalization (including imaging, microbiology, ancillary and laboratory) are listed below for reference.    Significant Diagnostic Studies: Dg Chest 2 View  10/11/2012  *RADIOLOGY REPORT*  Clinical Data: Shortness of breath.  Pneumonia.  CHEST - 2 VIEW  Comparison: None.  Findings: Small pleural effusions are seen bilaterally.  Low lung volumes are seen with subsegmental atelectasis at both lung bases. No evidence of pulmonary consolidation.  Heart borders are obscured by the pleural effusions, but not definitely enlarged.  IMPRESSION: Small bilateral pleural effusions and bibasilar subsegmental atelectasis.   Original Report Authenticated By: Myles Rosenthal, M.D.    Ct Angio Chest W/cm &/or Wo Cm  10/12/2012  *RADIOLOGY REPORT*  Clinical Data: Chest pain and shortness of breath.  Elevated D- dimer.  CT ANGIOGRAPHY CHEST  Technique:  Multidetector CT imaging of the chest using the standard protocol during bolus administration of intravenous contrast. Multiplanar reconstructed images including MIPs were obtained and reviewed to evaluate the vascular anatomy.  Contrast: OMNIPAQUE IOHEXOL 350 MG/ML SOLN  Comparison: Chest radiograph performed 10/11/2012  Findings: There is no evidence of pulmonary embolus; evaluation for pulmonary embolus is mildly suboptimal in areas of airspace consolidation.  Mildly loculated small to moderate bilateral pleural effusions are noted, with partial consolidation of both lower lung  lobes, more prominent on the right.  The expanded portions of the lungs appear grossly clear.  There is no evidence of pneumothorax.  No masses are identified, though evaluation for masses is suboptimal due to airspace consolidation; no abnormal focal contrast enhancement is seen.  The mediastinum is unremarkable in appearance.  No mediastinal lymphadenopathy is seen.  No pericardial effusion is identified. The great vessels are grossly unremarkable in appearance.  No axillary lymphadenopathy is seen.  The thyroid gland is unremarkable in appearance.  The visualized portions of the liver and spleen are unremarkable.  No acute osseous abnormalities are seen.  IMPRESSION:  1.  No evidence of pulmonary embolus. 2.  Mildly loculated small to moderate bilateral pleural effusions, with partial consolidation of both lower lung lobes, more prominent on the right.  Interstitial edema could have such an appearance, though interstitial prominence is less than would typically be expected; given clinical concern, pneumonia cannot be excluded.   Original Report Authenticated By: Tonia Ghent, M.D.    Dg Chest Port 1 View  10/12/2012  *RADIOLOGY REPORT*  Clinical Data: Bilateral pleural effusions. Thoracentesis.  PORTABLE CHEST - 1 VIEW  Comparison: Chest x-ray dated 10/11/2012 and chest CT dated 10/12/2012  Findings: Right pleural effusion has appreciably decreased.  No pneumothorax.  Persistent moderate left pleural effusion.  Heart size and vascularity are normal.  IMPRESSION: No pneumothorax after right thoracentesis.  Decreased right effusion.   Original Report Authenticated By: Francene Boyers, M.D.     Microbiology: Recent Results (from the past 240 hour(s))  BODY FLUID CULTURE     Status: Normal (Preliminary result)   Collection Time   10/12/12  1:59 PM      Component Value Range Status Comment   Specimen Description PLEURAL   Final    Special Requests Normal   Final    Gram Stain     Final    Value:  NO WBC  SEEN     NO ORGANISMS SEEN   Culture NO GROWTH 1 DAY   Final    Report Status PENDING   Incomplete      Labs: Basic Metabolic Panel:  Lab 10/12/12 1610 10/12/12  NA 140 137  K 3.6 3.1*  CL 105 102  CO2 25 26  GLUCOSE 91 112*  BUN 13 15  CREATININE 0.63 0.63  CALCIUM 8.6 8.5  MG -- --  PHOS -- --   Liver Function Tests:  Lab 10/12/12 1005  AST 17  ALT 13  ALKPHOS 86  BILITOT 0.4  PROT 6.0  ALBUMIN 2.6*   No results found for this basename: LIPASE:5,AMYLASE:5 in the last 168 hours No results found for this basename: AMMONIA:5 in the last 168 hours CBC:  Lab 10/12/12 1005 10/12/12  WBC 7.6 9.9  NEUTROABS 5.1 6.3  HGB 14.2 13.6  HCT 41.2 39.8  MCV 87.5 88.6  PLT 266 258   Cardiac Enzymes:  Lab 10/12/12 0420  CKTOTAL --  CKMB --  CKMBINDEX --  TROPONINI <0.30   BNP: BNP (last 3 results)  Basename 10/12/12 0420  PROBNP 34.0   CBG: No results found for this basename: GLUCAP:5 in the last 168 hours  Time coordinating discharge: 45 minutes  Signed:  Lillyth Spong  [facility] 10/13/2012, 4:08 PM

## 2012-10-15 LAB — BODY FLUID CULTURE
Culture: NO GROWTH
Gram Stain: NONE SEEN
Special Requests: NORMAL

## 2012-10-30 ENCOUNTER — Other Ambulatory Visit (INDEPENDENT_AMBULATORY_CARE_PROVIDER_SITE_OTHER): Payer: Medicare Other

## 2012-10-30 ENCOUNTER — Encounter: Payer: Self-pay | Admitting: Internal Medicine

## 2012-10-30 ENCOUNTER — Ambulatory Visit (INDEPENDENT_AMBULATORY_CARE_PROVIDER_SITE_OTHER): Payer: Medicare Other | Admitting: Internal Medicine

## 2012-10-30 ENCOUNTER — Ambulatory Visit (INDEPENDENT_AMBULATORY_CARE_PROVIDER_SITE_OTHER)
Admission: RE | Admit: 2012-10-30 | Discharge: 2012-10-30 | Disposition: A | Discharge status: Home / Self Care | Payer: Medicare Other | Source: Ambulatory Visit | Attending: Internal Medicine | Admitting: Internal Medicine

## 2012-10-30 VITALS — BP 110/64 | HR 74 | Temp 98.0°F | Ht 61.0 in | Wt 130.0 lb

## 2012-10-30 DIAGNOSIS — R091 Pleurisy: Secondary | ICD-10-CM

## 2012-10-30 LAB — CBC WITH DIFFERENTIAL/PLATELET
Basophils Absolute: 0 10*3/uL (ref 0.0–0.1)
Basophils Relative: 0.5 % (ref 0.0–3.0)
Eosinophils Absolute: 0.3 10*3/uL (ref 0.0–0.7)
Eosinophils Relative: 3.1 % (ref 0.0–5.0)
HCT: 40.3 % (ref 36.0–46.0)
Hemoglobin: 14 g/dL (ref 12.0–15.0)
Lymphocytes Relative: 20.6 % (ref 12.0–46.0)
Lymphs Abs: 1.8 10*3/uL (ref 0.7–4.0)
MCHC: 34.9 g/dL (ref 30.0–36.0)
MCV: 88.4 fl (ref 78.0–100.0)
Monocytes Absolute: 0.7 10*3/uL (ref 0.1–1.0)
Monocytes Relative: 7.6 % (ref 3.0–12.0)
Neutro Abs: 6.1 10*3/uL (ref 1.4–7.7)
Neutrophils Relative %: 68.2 % (ref 43.0–77.0)
Platelets: 282 10*3/uL (ref 150.0–400.0)
RBC: 4.55 Mil/uL (ref 3.87–5.11)
RDW: 13.2 % (ref 11.5–14.6)
WBC: 8.9 10*3/uL (ref 4.5–10.5)

## 2012-10-30 LAB — SEDIMENTATION RATE: Sed Rate: 38 mm/hr — ABNORMAL HIGH (ref 0–22)

## 2012-10-30 NOTE — Progress Notes (Signed)
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 Consider repeat CXR at that time. The following labs are pending at discharge: Body fluid culture from 12/5, cytology from 12/5.  Discharge Diagnoses:  Principal Problem:  *SOB (shortness of breath)  Active Problems:  Chest pain  Pleuritis      Bilateral pleural effusions due to pleuritis, NOS.  Compressive ATX  - s/p R thoracentesis 12/5 x 400 cc exudate, lymph predominant with inflammatory cytology  -ESR 70   10/30/2012 post hops f/u ov/Garl Speigner cc  Finished prednisone one week prior to OV  > a bit fatigued but otherwise feeling fine, no sign cough or sob. Back at work already.   No obvious daytime variabilty or assoc   cp or chest tightness, subjective wheeze overt sinus or hb symptoms. No unusual exp hx    Sleeping ok without nocturnal  or early am exacerbation  of respiratory  c/o's or need for noct saba. Also denies any obvious fluctuation of symptoms with weather or environmental changes or other aggravating or alleviating factors except as outlined above  ROS  The following are not active complaints unless bolded sore throat, dysphagia, dental problems, itching, sneezing,  nasal congestion or excess/ purulent secretions, ear ache,   fever, chills, sweats, unintended wt loss, pleuritic or exertional cp, hemoptysis,  orthopnea pnd or leg swelling, presyncope, palpitations, heartburn, abdominal pain, anorexia, nausea, vomiting, diarrhea  or change in bowel or urinary habits, change in stools or urine, dysuria,hematuria,  rash, arthralgias, visual complaints, headache, numbness weakness or ataxia or problems  with walking or coordination,  change in mood/affect or memory.     Review of Systems     Objective:   Physical Exam amb wf nad  Wt Readings from Last 3 Encounters:  10/30/12 130 lb (58.968 kg)  10/12/12 128 lb 8.5 oz (58.3 kg)    HEENT: nl dentition, turbinates, and orophanx. Nl external ear canals without cough reflex   NECK :  without JVD/Nodes/TM/ nl carotid upstrokes bilaterally   LUNGS: no acc muscle use, clear to A and P bilaterally without cough on insp or exp maneuvers   CV:  RRR  no s3 or murmur or increase in P2, no edema   ABD:  soft and nontender with nl excursion in the supine position. No bruits or organomegaly, bowel sounds nl  MS:  warm without deformities, calf tenderness, cyanosis or clubbing  SKIN: warm and dry without lesions    NEURO:  alert, approp, no deficits    CXR  10/30/2012 :  Persistent bibasilar linear atelectasis with decrease in small effusions.   ESR 10/30/2012 38    Assessment:          Plan:

## 2012-10-30 NOTE — Patient Instructions (Addendum)
Please remember to go to the lab and x-ray department downstairs for your tests - we will call you with the results when they are available.     Please schedule a follow up office visit in 6 weeks, call sooner if needed  

## 2012-10-30 NOTE — Progress Notes (Signed)
Quick Note:  Spoke with pt and notified of results per Dr. Wert. Pt verbalized understanding and denied any questions.  ______ 

## 2012-10-30 NOTE — Assessment & Plan Note (Signed)
-    s/p R thoracentesis 12/5 x 400 cc exudate, lymph predominant with inflammatory cytology   -  ESR 70 10/12/12  Down to 38 10/30/2012 one week off prednisone rx  Although a bit unusual to see viral pleuritis cause sign effusions, they were bilateral and not all that voluminous so I suppose this is w/in the range of natural hx for a viral process.  Have not ruled out some early partially treated connective tissue dz (many of which can present this way before joints are involved).    F/u should be in 4-6 weeks with final cxr and esr but we'll certainly see back in interim if needed

## 2012-12-11 ENCOUNTER — Other Ambulatory Visit (INDEPENDENT_AMBULATORY_CARE_PROVIDER_SITE_OTHER): Payer: Medicare Other

## 2012-12-11 ENCOUNTER — Ambulatory Visit (INDEPENDENT_AMBULATORY_CARE_PROVIDER_SITE_OTHER)
Admission: RE | Admit: 2012-12-11 | Discharge: 2012-12-11 | Disposition: A | Discharge status: Home / Self Care | Payer: Medicare Other | Source: Ambulatory Visit | Attending: Internal Medicine | Admitting: Internal Medicine

## 2012-12-11 ENCOUNTER — Encounter: Payer: Self-pay | Admitting: Internal Medicine

## 2012-12-11 ENCOUNTER — Ambulatory Visit (INDEPENDENT_AMBULATORY_CARE_PROVIDER_SITE_OTHER): Payer: Medicare Other | Admitting: Internal Medicine

## 2012-12-11 VITALS — BP 110/70 | HR 76 | Temp 98.0°F | Ht 61.0 in | Wt 127.6 lb

## 2012-12-11 DIAGNOSIS — R091 Pleurisy: Secondary | ICD-10-CM

## 2012-12-11 LAB — CBC WITH DIFFERENTIAL/PLATELET
Basophils Absolute: 0.1 10*3/uL (ref 0.0–0.1)
Basophils Relative: 0.6 % (ref 0.0–3.0)
Eosinophils Absolute: 0.2 10*3/uL (ref 0.0–0.7)
Eosinophils Relative: 2.8 % (ref 0.0–5.0)
HCT: 40.8 % (ref 36.0–46.0)
Hemoglobin: 14 g/dL (ref 12.0–15.0)
Lymphocytes Relative: 21.7 % (ref 12.0–46.0)
Lymphs Abs: 1.8 10*3/uL (ref 0.7–4.0)
MCHC: 34.3 g/dL (ref 30.0–36.0)
MCV: 87.4 fl (ref 78.0–100.0)
Monocytes Absolute: 0.6 10*3/uL (ref 0.1–1.0)
Monocytes Relative: 7.9 % (ref 3.0–12.0)
Neutro Abs: 5.5 10*3/uL (ref 1.4–7.7)
Neutrophils Relative %: 67 % (ref 43.0–77.0)
Platelets: 281 10*3/uL (ref 150.0–400.0)
RBC: 4.67 Mil/uL (ref 3.87–5.11)
RDW: 13.8 % (ref 11.5–14.6)
WBC: 8.2 10*3/uL (ref 4.5–10.5)

## 2012-12-11 LAB — SEDIMENTATION RATE: Sed Rate: 31 mm/hr — ABNORMAL HIGH (ref 0–22)

## 2012-12-11 NOTE — Patient Instructions (Signed)
Please remember to go to the lab and x-ray department downstairs for your tests - we will call you with the results when they are available.      

## 2012-12-11 NOTE — Assessment & Plan Note (Signed)
-   s/p R thoracentesis 12/5 x 400 cc exudate, lymph predominant with inflammatory cytology    -ESR 70 10/12/12  Down to 38 10/30/2012 one week off prednisone rx> 31 12/11/2012   C/w cap ? Viral vs other but resolving bt serial cxr/ esr so no further pulmonary eval unless worsens  I had an extended smmary  discussion with the patient today lasting 15 to 20 minutes of a 25 minute visit on the following issues:   Immune system sometimes causes more problems as the healing process goes on and this may have been the case here but no evidence of any ongoing connective tissue dz  Or for that matter any acute lung problem, just min scarring on cxr so pulmonary f/u can be prn.

## 2012-12-11 NOTE — Progress Notes (Signed)
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  Admit date: 10/11/2012  Discharge date: 10/13/2012  Recommendations for Outpatient Follow-up:  Continue ibuprofen, prednisone taper over 2 weeks and follow up with Pulmonary in 2 weeks. Consider repeat CXR at that time. The following labs are pending at discharge: Body fluid culture from 12/5, cytology from 12/5.  Discharge Diagnoses:  Principal Problem:  *SOB (shortness of breath)  Active Problems:  Chest pain  Pleuritis      Bilateral pleural effusions due to pleuritis, NOS.  Compressive ATX  - s/p R thoracentesis 12/5 x 400 cc exudate, lymph predominant with inflammatory cytology  -ESR 70   10/30/2012 post hops f/u ov/Suellyn Meenan cc  Finished prednisone one week prior to OV  > a bit fatigued but otherwise feeling fine, no sign cough or sob. Back at work already.  rec No change rx   12/11/2012 f/u ov/Kierston Plasencia cc pt states doing better still has a min dry morning cough hears crackles in the am.  Has some sob going up an down steps. Mostly still issue of fatigue > sob limiting her.   No obvious daytime variabilty or assoc excess mucus production  cp or chest tightness, subjective wheeze overt sinus or hb symptoms. No unusual exp hx    Sleeping ok without nocturnal  or early am exacerbation  of respiratory  c/o's or need for noct saba. Also denies any obvious fluctuation of symptoms with weather or environmental changes or other aggravating or alleviating factors except as outlined above  ROS  The following are not active complaints unless bolded sore throat, dysphagia, dental problems, itching, sneezing,  nasal congestion or excess/ purulent secretions, ear ache,   fever, chills, sweats, unintended wt loss, pleuritic or exertional cp, hemoptysis,  orthopnea pnd or leg swelling, presyncope,  palpitations, heartburn, abdominal pain, anorexia, nausea, vomiting, diarrhea  or change in bowel or urinary habits, change in stools or urine, dysuria,hematuria,  rash, arthralgias, visual complaints, headache, numbness weakness or ataxia or problems with walking or coordination,  change in mood/affect or memory.           Objective:   Physical Exam amb wf nad  12/11/2012  127   Wt Readings from Last 3 Encounters:  10/30/12 130 lb (58.968 kg)  10/12/12 128 lb 8.5 oz (58.3 kg)    HEENT: nl dentition, turbinates, and orophanx. Nl external ear canals without cough reflex   NECK :  without JVD/Nodes/TM/ nl carotid upstrokes bilaterally   LUNGS: no acc muscle use, clear to A and P bilaterally without cough on insp or exp maneuvers   CV:  RRR  no s3 or murmur or increase in P2, no edema   ABD:  soft and nontender with nl excursion in the supine position. No bruits or organomegaly, bowel sounds nl  MS:  warm without deformities, calf tenderness, cyanosis or clubbing  SKIN: warm and dry without lesions        CXR  12/11/2012 : 1. No significant change.  2. Plate-like linear atelectasis at the left and right lung  bases.      ESR 10/30/2012 38 > 31 12/11/2012 with nl cbc, no eos    Assessment:          Plan:

## 2013-02-01 ENCOUNTER — Other Ambulatory Visit: Payer: Self-pay

## 2013-02-01 DIAGNOSIS — Z1231 Encounter for screening mammogram for malignant neoplasm of breast: Secondary | ICD-10-CM

## 2013-02-06 ENCOUNTER — Other Ambulatory Visit: Payer: Self-pay | Admitting: Family Medicine

## 2013-02-06 ENCOUNTER — Ambulatory Visit
Admission: RE | Admit: 2013-02-06 | Discharge: 2013-02-06 | Disposition: A | Discharge status: Home / Self Care | Payer: Medicare Other | Source: Ambulatory Visit | Attending: Family Medicine | Admitting: Family Medicine

## 2013-02-06 DIAGNOSIS — R0789 Other chest pain: Secondary | ICD-10-CM

## 2013-02-09 ENCOUNTER — Ambulatory Visit (INDEPENDENT_AMBULATORY_CARE_PROVIDER_SITE_OTHER): Payer: Medicare Other | Admitting: Internal Medicine

## 2013-02-09 ENCOUNTER — Encounter: Payer: Self-pay | Admitting: Internal Medicine

## 2013-02-09 VITALS — BP 122/88 | HR 80 | Temp 97.8°F | Ht 61.0 in | Wt 121.0 lb

## 2013-02-09 DIAGNOSIS — R091 Pleurisy: Secondary | ICD-10-CM

## 2013-02-09 NOTE — Patient Instructions (Addendum)
In event the pain is worse rec up to 4 advil with each meal and if not improving over several days we need to see you right away.

## 2013-02-09 NOTE — Progress Notes (Signed)
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 Consider repeat CXR at that time. The following labs are pending at discharge: Body fluid culture from 12/5, cytology from 12/5.  Discharge Diagnoses:  Principal Problem:  *SOB (shortness of breath)  Active Problems:  Chest pain  Pleuritis      Bilateral pleural effusions due to pleuritis, NOS.  Compressive ATX  - s/p R thoracentesis 10/12/12 x 400 cc exudate, lymph predominant with inflammatory cytology  -ESR 70   10/30/2012 post hops f/u ov/English Craighead cc  Finished prednisone one week prior to OV  > a bit fatigued but otherwise feeling fine, no sign cough or sob. Back at work already.  rec No change rx   12/11/2012 f/u ov/Terrence Wishon cc pt states doing better still has a min dry morning cough hears crackles in the am.  Has some sob going up an down steps. Mostly still issue of fatigue > sob limiting her. rec   02/09/2013 f/u ov/Ronin Rehfeldt cc cough gone, fatigue, bilateral completely gone then 2 weeks prior to OV  Indolent onset R lateral cp where previous more post and = bilaterally,  Min worse with deep breath. Took advil and improved only notices with deep breath or bending over, no other aches or pains, no fever.  No assoc sob  Was worse supine position now ok flat    No obvious daytime variabilty or assoc excess mucus production  cp or chest tightness, subjective wheeze overt sinus or hb symptoms. No unusual exp hx    Sleeping ok without nocturnal  or early am exacerbation  of respiratory  c/o's or need for noct saba. Also denies any obvious fluctuation of symptoms with weather or environmental changes or other  aggravating or alleviating factors except as outlined above  ROS  The following are not active complaints unless bolded sore throat, dysphagia, dental problems, itching, sneezing,  nasal congestion or excess/ purulent secretions, ear ache,   fever, chills, sweats, unintended wt loss, pleuritic cp or exertional cp, hemoptysis,  orthopnea pnd or leg swelling, presyncope, palpitations, heartburn, abdominal pain, anorexia, nausea, vomiting, diarrhea  or change in bowel or urinary habits, change in stools or urine, dysuria,hematuria,  rash, arthralgias, visual complaints, headache, numbness weakness or ataxia or problems with walking or coordination,  change in mood/affect or memory.           Objective:   Physical Exam amb wf nad  12/11/2012  127 > 02/09/2013  121   Wt Readings from Last 3 Encounters:  10/30/12 130 lb (58.968 kg)  10/12/12 128 lb 8.5 oz (58.3 kg)    HEENT: nl dentition, turbinates, and orophanx. Nl external ear canals without cough reflex   NECK :  without JVD/Nodes/TM/ nl carotid upstrokes bilaterally   LUNGS: no acc muscle use, clear to A and P bilaterally without cough on insp or exp maneuvers   CV:  RRR  no s3 or murmur or increase in P2, no edema   ABD:  soft and nontender with nl excursion in the supine position. No bruits or organomegaly, bowel sounds nl  MS:  warm without deformities, calf tenderness, cyanosis or clubbing  SKIN: warm and  dry without lesions        CXR  12/11/2012 : 1. No significant change.  2. Plate-like linear atelectasis at the left and right lung  bases.  cxr 02/06/13 Some worsening of linear atelectasis at the lung bases. Small  pleural effusions are present. - my overread > aeration overall better         Assessment:          Plan:

## 2013-02-11 NOTE — Assessment & Plan Note (Signed)
-   s/p R thoracentesis 12/5 x 400 cc exudate, lymph predominant with inflammatory cytology    -ESR 70 10/12/12  Down to 38 10/30/2012 one week off prednisone rx> 31 12/11/2012   She undoubtedly has scar tissue from previous pleuritis and may have torn one of the adhesions slightly though the bilateral nature of her complaints and the high esr are bothersome for underlying connective tissue dz  there is no sign reaccumulation of effusion so can f/u conservatively for now on nsaids.

## 2013-03-12 ENCOUNTER — Ambulatory Visit
Admission: RE | Admit: 2013-03-12 | Discharge: 2013-03-12 | Disposition: A | Discharge status: Home / Self Care | Payer: Medicare Other | Source: Ambulatory Visit

## 2013-03-12 DIAGNOSIS — Z1231 Encounter for screening mammogram for malignant neoplasm of breast: Secondary | ICD-10-CM

## 2013-06-13 ENCOUNTER — Other Ambulatory Visit: Payer: Self-pay

## 2013-09-03 ENCOUNTER — Other Ambulatory Visit: Payer: Self-pay | Admitting: Dermatology

## 2013-09-13 ENCOUNTER — Other Ambulatory Visit: Payer: Self-pay

## 2013-10-15 ENCOUNTER — Other Ambulatory Visit: Payer: Self-pay | Admitting: Dermatology

## 2014-01-28 ENCOUNTER — Other Ambulatory Visit: Payer: Self-pay

## 2014-01-28 DIAGNOSIS — Z1231 Encounter for screening mammogram for malignant neoplasm of breast: Secondary | ICD-10-CM

## 2014-03-18 ENCOUNTER — Ambulatory Visit
Admission: RE | Admit: 2014-03-18 | Discharge: 2014-03-18 | Disposition: A | Discharge status: Home / Self Care | Payer: Medicare Other | Source: Ambulatory Visit

## 2014-03-18 DIAGNOSIS — Z1231 Encounter for screening mammogram for malignant neoplasm of breast: Secondary | ICD-10-CM

## 2014-05-20 ENCOUNTER — Other Ambulatory Visit: Payer: Self-pay | Admitting: Obstetrics and Gynecology

## 2014-05-20 ENCOUNTER — Other Ambulatory Visit (HOSPITAL_COMMUNITY)
Admission: RE | Admit: 2014-05-20 | Discharge: 2014-05-20 | Disposition: A | Discharge status: Home / Self Care | Payer: Medicare Other | Source: Ambulatory Visit | Attending: Obstetrics and Gynecology | Admitting: Obstetrics and Gynecology

## 2014-05-20 DIAGNOSIS — Z124 Encounter for screening for malignant neoplasm of cervix: Secondary | ICD-10-CM | POA: Insufficient documentation

## 2014-05-20 DIAGNOSIS — Z1151 Encounter for screening for human papillomavirus (HPV): Secondary | ICD-10-CM | POA: Insufficient documentation

## 2014-05-21 LAB — CYTOLOGY - PAP

## 2014-07-09 ENCOUNTER — Other Ambulatory Visit: Payer: Self-pay | Admitting: Dermatology

## 2014-08-23 ENCOUNTER — Other Ambulatory Visit: Payer: Self-pay

## 2015-02-21 ENCOUNTER — Ambulatory Visit (INDEPENDENT_AMBULATORY_CARE_PROVIDER_SITE_OTHER)
Admission: RE | Admit: 2015-02-21 | Discharge: 2015-02-21 | Disposition: A | Discharge status: Home / Self Care | Payer: Medicare Other | Source: Ambulatory Visit | Attending: Internal Medicine | Admitting: Internal Medicine

## 2015-02-21 ENCOUNTER — Ambulatory Visit (INDEPENDENT_AMBULATORY_CARE_PROVIDER_SITE_OTHER): Payer: Medicare Other | Admitting: Internal Medicine

## 2015-02-21 ENCOUNTER — Encounter: Payer: Self-pay | Admitting: Internal Medicine

## 2015-02-21 VITALS — BP 134/70 | HR 80 | Ht 61.0 in | Wt 128.0 lb

## 2015-02-21 DIAGNOSIS — R091 Pleurisy: Secondary | ICD-10-CM | POA: Diagnosis not present

## 2015-02-21 DIAGNOSIS — R059 Cough, unspecified: Secondary | ICD-10-CM

## 2015-02-21 DIAGNOSIS — R05 Cough: Secondary | ICD-10-CM

## 2015-02-21 NOTE — Progress Notes (Signed)
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 Consider repeat CXR at that time. The following labs are pending at discharge: Body fluid culture from 12/5, cytology from 12/5.  Discharge Diagnoses:  Principal Problem:  *SOB (shortness of breath)  Active Problems:  Chest pain  Pleuritis      Bilateral pleural effusions due to pleuritis, NOS.  Compressive ATX  - s/p R thoracentesis 10/12/12 x 400 cc exudate, lymph predominant with inflammatory cytology  -ESR 70   10/30/2012 post hops f/u ov/Jakeia Carreras cc  Finished prednisone one week prior to OV  > a bit fatigued but otherwise feeling fine, no sign cough or sob. Back at work already.  rec No change rx   12/11/2012 f/u ov/Teyton Pattillo cc pt states doing better still has a min dry morning cough hears crackles in the am.  Has some sob going up an down steps. Mostly still issue of fatigue > sob limiting her. rec No change rx   02/09/2013 f/u ov/Treylen Gibbs cc cough gone, fatigue, bilateral completely gone then 2 weeks prior to [place] onset R lateral cp where previous more post and = bilaterally,  Min worse with deep breath. Took advil and improved only notices with deep breath or bending over, no other aches or pains, no fever.  No assoc sob rec advil prn    02/21/2015 acute ov/Marcquis Ridlon re: new cough  Chief Complaint  Patient presents with  . Acute Visit    Pt c/o cough since Dec 2015- occ prod with minimal clear sputum. Cough seems worse in the am's.    onset was abrupt like flu in dec 2015 assoc with minimal fever/ aches resolved w/in days but then cough worsened / mostly dry daytime Worse in am after stirs around and  then again before supper Does cc some rhinitis but no worse since cough actually better this year on clariton      Kouffman Reflux v Neurogenic Cough Differentiator Reflux Comments  Do you awaken from a sound sleep coughing violently?                            With trouble breathing? no   Do you have choking episodes when you cannot  Get enough air, gasping for air ?              no   Do you usually cough when you lie down into  The bed, or when you just lie down to rest ?                          no   Do you usually cough after meals or eating?         no   Do you cough when (or after) you bend over?    No    GERD SCORE     Kouffman Reflux v Neurogenic Cough Differentiator Neurogenic   Do you more-or-less cough all day long? No    Does change of temperature make you cough? no   Does laughing or chuckling cause you to cough? no  Do fumes (perfume, automobile fumes, burned  Toast, etc.,) cause you to cough ?      no   Does speaking, singing, or talking on the phone cause you to cough   ?               no   Neurogenic/Airway score       No obvious day to day or daytime variabilty or assoc sob or cp or chest tightness, subjective wheeze overt sinus or hb symptoms. No unusual exp hx or h/o childhood pna/ asthma or knowledge of premature birth.  Sleeping ok without nocturnal  or early am exacerbation  of respiratory  c/o's or need for noct saba. Also denies any obvious fluctuation of symptoms with weather or environmental changes or other aggravating or alleviating factors except as outlined above   Current Medications, Allergies, Complete Past Medical History, Past Surgical History, Family History, and Social History were reviewed in [employer] record.  ROS  The following are not active complaints unless bolded sore throat, dysphagia, dental problems, itching, sneezing,  nasal congestion or excess/ purulent secretions, ear ache,   fever, chills, sweats,  unintended wt loss, pleuritic or exertional cp, hemoptysis,  orthopnea pnd or leg swelling, presyncope, palpitations, heartburn, abdominal pain, anorexia, nausea, vomiting, diarrhea  or change in bowel or urinary habits, change in stools or urine, dysuria,hematuria,  rash, arthralgias, visual complaints, headache, numbness weakness or ataxia or problems with walking or coordination,  change in mood/affect or memory.                           Objective:    Physical Exam  amb wf nad  12/11/2012  127 > 02/09/2013  121 > 02/21/2015   128  Wt Readings from Last 3 Encounters:  10/30/12 130 lb (58.968 kg)  10/12/12 128 lb 8.5 oz (58.3 kg)    HEENT: nl dentition, turbinates, and orophanx. Nl external ear canals without cough reflex   NECK :  without JVD/Nodes/TM/ nl carotid upstrokes bilaterally   LUNGS: no acc muscle use, clear to A and P bilaterally without cough on insp or exp maneuvers    CV:  RRR  no s3 or murmur or increase in P2, no edema   ABD:  soft and nontender with nl excursion in the supine position. No bruits or organomegaly, bowel sounds nl  MS:  warm without deformities, calf tenderness, cyanosis or clubbing  SKIN: warm and dry without lesions         CXR PA and Lateral:   02/21/2015 :     I personally reviewed images and agree with radiology impression as follows:   Small stable effusions or chronic pleural thickening with underlying atelectasis. Mildly improved aeration at the lung bases when compared to prior study.       Assessment:

## 2015-02-21 NOTE — Patient Instructions (Addendum)
Pantoprazole (protonix) 40 mg   Take 30-60 min before first meal of the day and Pepcid 20 mg one bedtime until return to office   For drainage take chlortrimeton (chlorpheniramine) 4 mg every 4 hours available over the counter (may cause drowsiness) - take two at bedtime   GERD (REFLUX)  is an extremely common cause of respiratory symptoms just like yours , many times with no obvious heartburn at all.    It can be treated with medication, but also with lifestyle changes including avoidance of late meals, excessive alcohol, smoking cessation, and avoid fatty foods, chocolate, peppermint, colas, red wine, and acidic juices such as orange juice.  NO MINT OR MENTHOL PRODUCTS SO NO COUGH DROPS  USE SUGARLESS CANDY INSTEAD (Jolley ranchers or Stover's or Life Savers) or even ice chips will also do - the key is to swallow to prevent all throat clearing. NO OIL BASED VITAMINS - use powdered substitutes.  For cough use delsym 2 tsp every 12 hours as needed   Please remember to go to the xray department downstairs for your tests - we will call you with the results when they are available.   Please schedule a follow up office visit in 4 weeks, sooner if needed

## 2015-02-22 ENCOUNTER — Encounter: Payer: Self-pay | Admitting: Internal Medicine

## 2015-02-22 NOTE — Assessment & Plan Note (Signed)
-  s/p R thoracentesis 12/5 x 400 cc exudate, lymph predominant with inflammatory cytology    -ESR 70 10/12/12  Down to 38 10/30/2012 one week off prednisone rx> 31 12/11/2012   Never clear as to cause but cxr is much better vs previous studies with minimal residual pleural thickening so no further f/u needed 

## 2015-02-22 NOTE — Assessment & Plan Note (Addendum)
The most common causes of chronic cough in immunocompetent adults include the following: upper airway cough syndrome (UACS), previously referred to as postnasal drip syndrome (PNDS), which is caused by variety of rhinosinus conditions; (2) asthma; (3) GERD; (4) chronic bronchitis from cigarette smoking or other inhaled environmental irritants; (5) nonasthmatic eosinophilic bronchitis; and (6) bronchiectasis.   These conditions, singly or in combination, have accounted for up to 94% of the causes of chronic cough in prospective studies.   Other conditions have constituted no >6% of the causes in prospective studies These have included bronchogenic carcinoma, chronic interstitial pneumonia, sarcoidosis, left ventricular failure, ACEI-induced cough, and aspiration from a condition associated with pharyngeal dysfunction.    Chronic cough is often simultaneously caused by more than one condition. A single cause has been found from 38 to 82% of the time, multiple causes from 18 to 62%. Multiply caused cough has been the result of three diseases up to 42% of the time.       Based on hx and exam, this is most likely:  Classic Upper airway cough syndrome, so named because it's frequently impossible to sort out how much is  CR/sinusitis with freq throat clearing (which can be related to primary GERD)   vs  causing  secondary (" extra esophageal")  GERD from wide swings in gastric pressure that occur with throat clearing, often  promoting self use of mint and menthol lozenges that reduce the lower esophageal sphincter tone and exacerbate the problem further in a cyclical fashion.   These are the same pts (now being labeled as having "irritable larynx syndrome" by some cough centers) who not infrequently have a history of having failed to tolerate ace inhibitors,  dry powder inhalers or biphosphonates or report having atypical reflux symptoms that don't respond to standard doses of PPI , and are easily confused as  having aecopd or asthma flares by even experienced allergists/ pulmonologists.   The first step is to maximize acid suppression and pnds with 1st gen h1 then regroup in 4 weeks  Explained to pt  The standardized cough guidelines published in Chest by Lissa Morales in 2006 are still the best available and consist of a multiple step process (up to 12!) , not a single office visit,  and are intended  to address this problem logically,  with an alogrithm dependent on response to empiric treatment at  each progressive step  to determine a specific diagnosis with  minimal addtional testing needed. Therefore if adherence is an issue or can't be accurately verified,  it's very unlikely the standard evaluation and treatment will be successful here.    Furthermore, response to therapy (other than acute cough suppression, which should only be used short term with avoidance of narcotic containing cough syrups if possible), can be a gradual process for which the patient may perceive immediate benefit.  Unlike going to an eye doctor where the best perscription is almost always the first one and is immediately effective, this is almost never the case in the management of chronic cough syndromes. Therefore the patient needs to commit up front to consistently adhere to recommendations  for up to 6 weeks of therapy directed at the likely underlying problem(s) before the response can be reasonably evaluated.      See instructions for specific recommendations which were reviewed directly with the patient who was given a copy with highlighter outlining the key components.

## 2015-02-24 ENCOUNTER — Telehealth: Payer: Self-pay | Admitting: Internal Medicine

## 2015-02-24 MED ORDER — PANTOPRAZOLE SODIUM 40 MG PO TBEC: 40.0000 mg | DELAYED_RELEASE_TABLET | Freq: Every day | ORAL | Status: AC

## 2015-02-24 NOTE — Telephone Encounter (Signed)
Wrong chart. Nothing further needed.

## 2015-02-24 NOTE — Telephone Encounter (Signed)
Patient notifying our office that Pantoprazole was never sent to her pharmacy.  Sent Rx to pharmacy based upon last OV.  Patient notified. Nothing further needed.

## 2015-02-24 NOTE — Telephone Encounter (Signed)
Pt. Returning call from nurse [phone], [phone]

## 2015-03-17 ENCOUNTER — Other Ambulatory Visit: Payer: Self-pay

## 2015-03-17 DIAGNOSIS — Z1231 Encounter for screening mammogram for malignant neoplasm of breast: Secondary | ICD-10-CM

## 2015-03-21 ENCOUNTER — Encounter: Payer: Self-pay | Admitting: Internal Medicine

## 2015-03-21 ENCOUNTER — Ambulatory Visit (INDEPENDENT_AMBULATORY_CARE_PROVIDER_SITE_OTHER): Payer: Medicare Other | Admitting: Internal Medicine

## 2015-03-21 ENCOUNTER — Ambulatory Visit (INDEPENDENT_AMBULATORY_CARE_PROVIDER_SITE_OTHER)
Admission: RE | Admit: 2015-03-21 | Discharge: 2015-03-21 | Disposition: A | Discharge status: Home / Self Care | Payer: Medicare Other | Source: Ambulatory Visit | Attending: Internal Medicine | Admitting: Internal Medicine

## 2015-03-21 ENCOUNTER — Other Ambulatory Visit (INDEPENDENT_AMBULATORY_CARE_PROVIDER_SITE_OTHER): Payer: Medicare Other

## 2015-03-21 DIAGNOSIS — R059 Cough, unspecified: Secondary | ICD-10-CM

## 2015-03-21 DIAGNOSIS — R05 Cough: Secondary | ICD-10-CM

## 2015-03-21 LAB — CBC WITH DIFFERENTIAL/PLATELET
Basophils Absolute: 0 10*3/uL (ref 0.0–0.1)
Basophils Relative: 0.6 % (ref 0.0–3.0)
Eosinophils Absolute: 0.2 10*3/uL (ref 0.0–0.7)
Eosinophils Relative: 2.9 % (ref 0.0–5.0)
HCT: 38.2 % (ref 36.0–46.0)
Hemoglobin: 12.8 g/dL (ref 12.0–15.0)
Lymphocytes Relative: 19.6 % (ref 12.0–46.0)
Lymphs Abs: 1.5 10*3/uL (ref 0.7–4.0)
MCHC: 33.5 g/dL (ref 30.0–36.0)
MCV: 84.8 fl (ref 78.0–100.0)
Monocytes Absolute: 0.6 10*3/uL (ref 0.1–1.0)
Monocytes Relative: 7.8 % (ref 3.0–12.0)
Neutro Abs: 5.4 10*3/uL (ref 1.4–7.7)
Neutrophils Relative %: 69.1 % (ref 43.0–77.0)
Platelets: 346 10*3/uL (ref 150.0–400.0)
RBC: 4.51 Mil/uL (ref 3.87–5.11)
RDW: 14.5 % (ref 11.5–15.5)
WBC: 7.9 10*3/uL (ref 4.0–10.5)

## 2015-03-21 MED ORDER — PREDNISONE 10 MG PO TABS: ORAL_TABLET | ORAL | Status: DC

## 2015-03-21 NOTE — Patient Instructions (Addendum)
Prednisone 10 mg take  4 each am x 2 days,   2 each am x 2 days,  1 each am x 2 days and stop   For drainage/tickle try chlortabs(chlorpheniramine) 4 mg x 1 during the day up to every 4 hours available over the counter (may cause drowsiness)   Please see patient coordinator before you leave today  to schedule sinus CT  Continue acid suppression and the diet as before plus the 2 chlortabs at bedtime  Please remember to go to the lab   department downstairs for your tests - we will call you with the results when they are available.  If not satisfied the next step a methacholine challenge test - call Libby at [facility] and she will schedule and this and follow up with me

## 2015-03-21 NOTE — Progress Notes (Signed)
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  Compressive ATX  - s/p R thoracentesis 10/12/12 x 400 cc exudate, lymph predominant with inflammatory cytology  - ESR 70   10/30/2012 post hops f/u ov/Kinze Labo cc  Finished prednisone one week prior to OV  > a bit fatigued but otherwise feeling fine, no sign cough or sob. Back at work already.  rec No change rx   12/11/2012 f/u ov/Adda Stokes cc pt states doing better still has a min dry morning cough hears crackles in the am.  Has some sob going up an down steps. Mostly still issue of fatigue > sob limiting her. rec No change rx     02/21/2015 acute ov/Jamie Belger re: new cough  Chief Complaint  Patient presents with  . Acute Visit    Pt c/o cough since Dec 2015- occ prod with minimal clear sputum. Cough seems worse in the am's.   onset was abrupt like flu in dec 2015 assoc with minimal fever/ aches resolved w/in days but then cough worsened / mostly dry daytime Worse in am after stirs around and then again before supper Does cc some rhinitis but no worse since cough actually better this year on clariton  rec Pantoprazole (protonix) 40 mg   Take 30-60 min before first meal of the day and Pepcid 20 mg one bedtime until return to office  For drainage take chlortrimeton (chlorpheniramine) 4 mg every 4 hours available over the counter (may cause drowsiness) - take two at bedtime  GERD diet  For cough use delsym 2 tsp every 12 hours as needed    03/21/2015 f/u ov/Taronda Comacho re:  Cough since dec 2015  Chief Complaint  Patient presents with  . Follow-up    Cough is slightly better. No new co's  today.  no noct cough at all and only occurs p stirs in am assoc with urge to clear throat, has never tried daytime h1  Not limited by breathing from desired activities    No obvious day to day or daytime variabilty or assoc   cp or chest tightness, subjective wheeze overt sinus or hb symptoms. No unusual exp hx or h/o childhood pna/ asthma or knowledge of premature birth.  Sleeping ok without nocturnal  or early am exacerbation  of respiratory  c/o's or need for noct saba. Also denies any obvious fluctuation of symptoms with weather or environmental changes or other aggravating or alleviating factors except as outlined above   Current Medications, Allergies, Complete Past Medical History, Past Surgical History, Family History, and Social History were reviewed in [employer] record.  ROS  The following are not active complaints unless bolded sore throat, dysphagia, dental problems, itching, sneezing,  nasal congestion or excess/ purulent secretions, ear ache,   fever, chills, sweats, unintended wt loss, pleuritic or exertional cp, hemoptysis,  orthopnea pnd or leg swelling, presyncope, palpitations, heartburn, abdominal pain, anorexia, nausea, vomiting, diarrhea  or change in bowel or urinary habits, change in stools or urine, dysuria,hematuria,  rash, arthralgias, visual complaints, headache, numbness weakness or ataxia or problems with walking or coordination,  change in  mood/affect or memory.                  Objective:    Physical Exam  amb wf nad occ throat clearing   12/11/2012  127 > 02/09/2013  121 > 02/21/2015   128 > 03/21/2015  125   Wt Readings from Last 3 Encounters:  10/30/12 130 lb (58.968 kg)  10/12/12 128 lb 8.5 oz (58.3 kg)    HEENT: nl dentition, turbinates, and orophanx. Nl external ear canals without cough reflex   NECK :  without JVD/Nodes/TM/ nl carotid upstrokes bilaterally   LUNGS: no acc muscle use, clear to A and P bilaterally without  cough on insp or exp maneuvers    CV:  RRR  no s3 or murmur or increase in P2, no edema   ABD:  soft and nontender with nl excursion in the supine position. No bruits or organomegaly, bowel sounds nl  MS:  warm without deformities, calf tenderness, cyanosis or clubbing  SKIN: warm and dry without lesions         CXR PA and Lateral:   02/21/2015 :     I personally reviewed images and agree with radiology impression as follows:   Small stable effusions or chronic pleural thickening with underlying atelectasis. Mildly improved aeration at the lung bases when compared to prior study.       Assessment:

## 2015-03-23 ENCOUNTER — Encounter: Payer: Self-pay | Admitting: Internal Medicine

## 2015-03-23 NOTE — Assessment & Plan Note (Addendum)
-   onset Dec 2015  - trial of max gerd rx 02/21/15 - Allergy profile 03/21/15 >  Eos 0.2   - CT sinus 03/21/15  Subtle opacification over the region of the right ostiomeatal complex, otherwise no significant sinus inflammatory disease. Slight nasal septal deviation as described   I had an extended discussion with the patient reviewing all relevant studies completed to date and  lasting 15 to 20 minutes of a 25 minute visit on the following ongoing concerns:  1) The standardized cough guidelines published in Chest by Lissa Morales in 2006 are still the best available and consist of a multiple step process (up to 12!) , not a single office visit,  and are intended  to address this problem logically,  with an alogrithm dependent on response to empiric treatment at  each progressive step  to determine a specific diagnosis with  minimal addtional testing needed. Therefore if adherence is an issue or can't be accurately verified,  it's very unlikely the standard evaluation and treatment will be successful here.    Furthermore, response to therapy (other than acute cough suppression, which should only be used short term with avoidance of narcotic containing cough syrups if possible), can be a gradual process for which the patient may perceive immediate benefit.  Unlike going to an eye doctor where the best perscription is almost always the first one and is immediately effective, this is almost never the case in the management of chronic cough syndromes. Therefore the patient needs to commit up front to consistently adhere to recommendations  for up to 6 weeks of therapy directed at the likely underlying problem(s) before the response can be reasonably evaluated.    2) next step is add 1st gen h1 prn and if no better then MCT then consider trial of neurontin as most likley dx is irritable larynx syndrome if all the above measures are not productive.   3) Each maintenance medication was reviewed in detail  including most importantly the difference between maintenance and as needed and under what circumstances the prns are to be used.  Please see instructions for details which were reviewed in writing and the patient given a copy.

## 2015-03-24 ENCOUNTER — Telehealth: Payer: Self-pay | Admitting: Internal Medicine

## 2015-03-24 LAB — ALLERGY FULL PROFILE
Allergen, D pternoyssinus,d7: <0.1 kU/L
Allergen,Goose feathers, e70: <0.1 kU/L
Alternaria Alternata: <0.1 kU/L
Aspergillus fumigatus, m3: <0.1 kU/L
Bahia Grass: <0.1 kU/L
Bermuda Grass: <0.1 kU/L
Box Elder IgE: <0.1 kU/L
Candida Albicans: <0.1 kU/L
Cat Dander: <0.1 kU/L
Common Ragweed: <0.1 kU/L
Curvularia lunata: <0.1 kU/L
D. farinae: <0.1 kU/L
Dog Dander: <0.1 kU/L
Elm IgE: <0.1 kU/L
Fescue: <0.1 kU/L
G005 Rye, Perennial: <0.1 kU/L
G009 Red Top: <0.1 kU/L
Goldenrod: <0.1 kU/L
Helminthosporium halodes: <0.1 kU/L
House Dust Hollister: <0.1 kU/L
IgE (Immunoglobulin E), Serum: 28 kU/L (ref <115)
Lamb's Quarters: <0.1 kU/L
Oak: <0.1 kU/L
Plantain: <0.1 kU/L
Stemphylium Botryosum: <0.1 kU/L
Sycamore Tree: <0.1 kU/L
Timothy Grass: <0.1 kU/L

## 2015-03-24 NOTE — Progress Notes (Signed)
Quick Note:  Spoke with pt and notified of results per Dr. Wert. Pt verbalized understanding and denied any questions.  ______ 

## 2015-03-24 NOTE — Telephone Encounter (Signed)
Spoke with the pt and notified of results of sinus ct  She verbalized understanding

## 2015-03-24 NOTE — Progress Notes (Signed)
Quick Note:  LMTCB ______ 

## 2015-03-26 NOTE — Progress Notes (Signed)
Quick Note:  Called and spoke to patient. Informed pt of results. Pt verbalized understanding and denied any further questions or concerns at this time.   ______

## 2015-04-14 ENCOUNTER — Ambulatory Visit
Admission: RE | Admit: 2015-04-14 | Discharge: 2015-04-14 | Disposition: A | Discharge status: Home / Self Care | Payer: Medicare Other | Source: Ambulatory Visit

## 2015-04-14 DIAGNOSIS — Z1231 Encounter for screening mammogram for malignant neoplasm of breast: Secondary | ICD-10-CM

## 2015-05-05 ENCOUNTER — Other Ambulatory Visit: Payer: Self-pay

## 2015-06-09 ENCOUNTER — Other Ambulatory Visit: Payer: Self-pay | Admitting: Dermatology

## 2016-01-26 ENCOUNTER — Other Ambulatory Visit: Payer: Self-pay

## 2016-01-26 DIAGNOSIS — Z1231 Encounter for screening mammogram for malignant neoplasm of breast: Secondary | ICD-10-CM

## 2016-04-19 ENCOUNTER — Ambulatory Visit: Payer: Medicare Other

## 2016-04-26 ENCOUNTER — Ambulatory Visit
Admission: RE | Admit: 2016-04-26 | Discharge: 2016-04-26 | Disposition: A | Discharge status: Home / Self Care | Payer: Medicare Other | Source: Ambulatory Visit

## 2016-04-26 ENCOUNTER — Other Ambulatory Visit: Payer: Self-pay | Admitting: Obstetrics and Gynecology

## 2016-04-26 DIAGNOSIS — Z1231 Encounter for screening mammogram for malignant neoplasm of breast: Secondary | ICD-10-CM

## 2016-11-15 IMAGING — CR DG CHEST 2V
2 series · 2 of 2 positions shown · non-contrast
Comparison: 02/06/2013

CLINICAL DATA: Subsequent evaluation of pleural effusions, chronic
cough for 5 months

EXAM:
CHEST  2 VIEW

[view not recorded (1 of 2)]
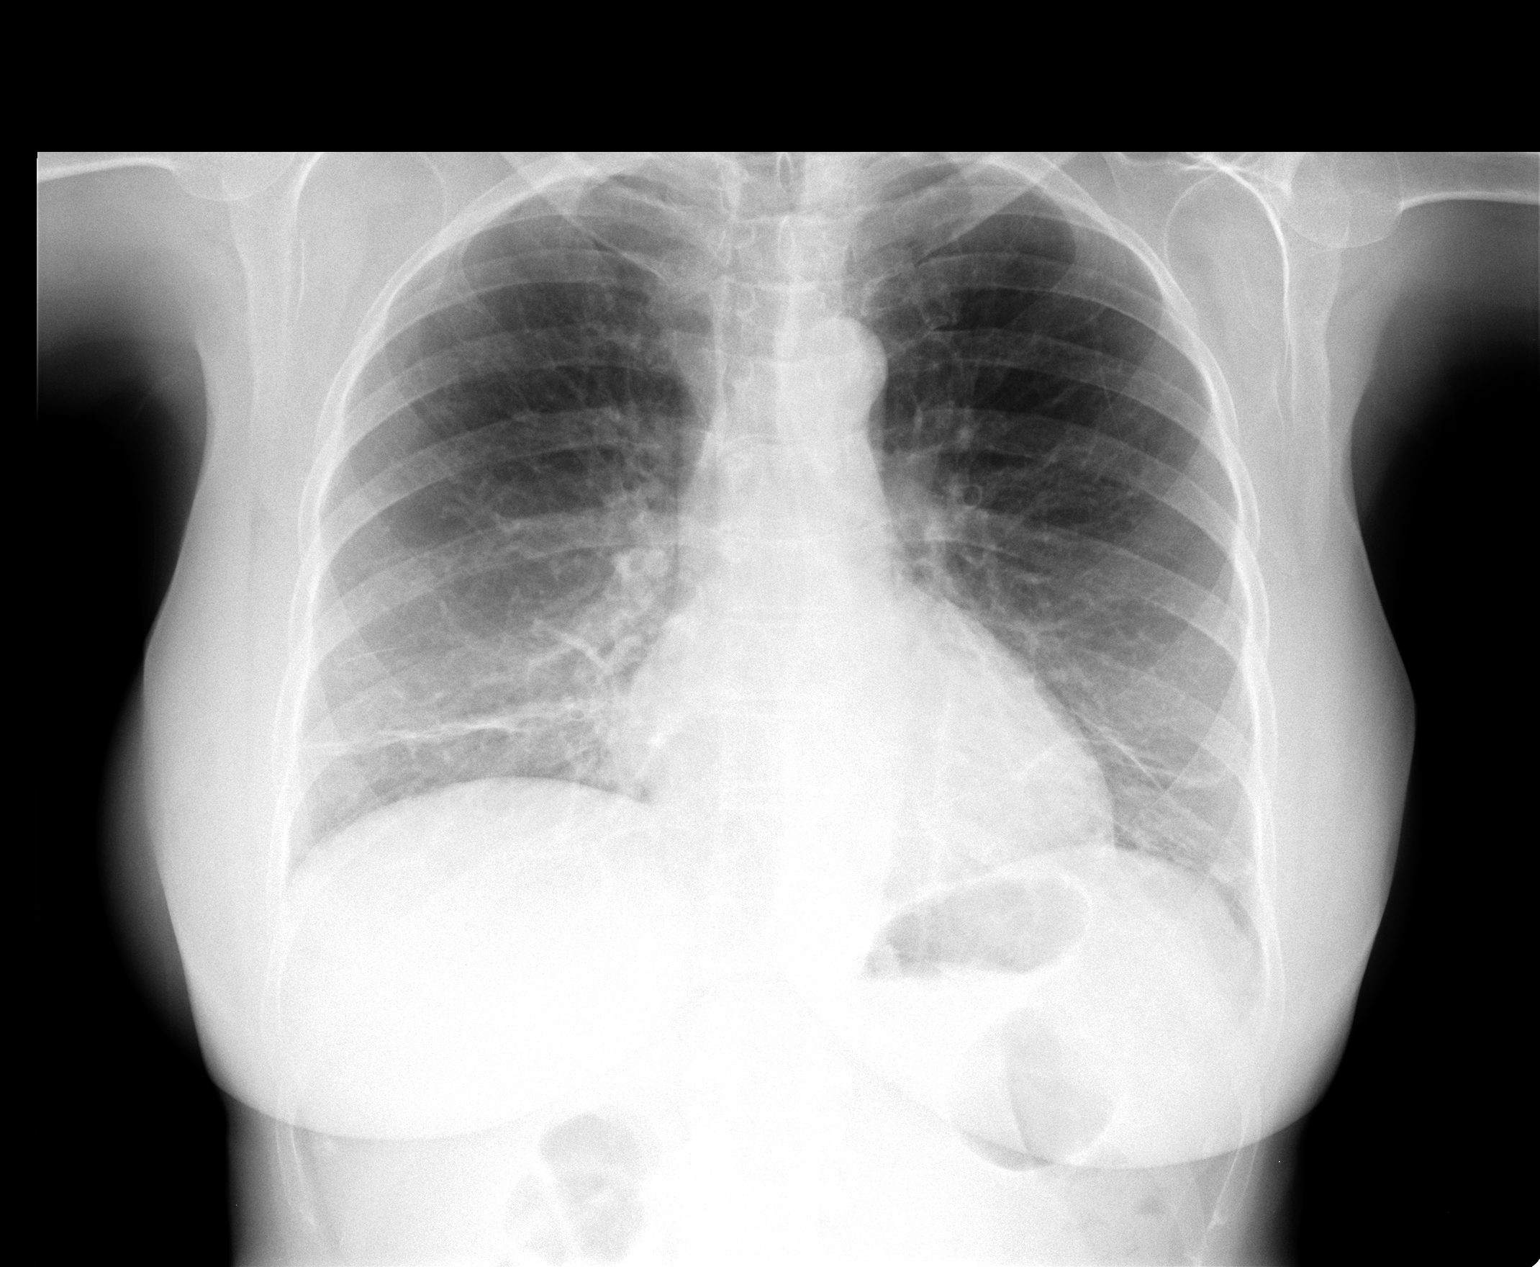

[view not recorded (2 of 2)]
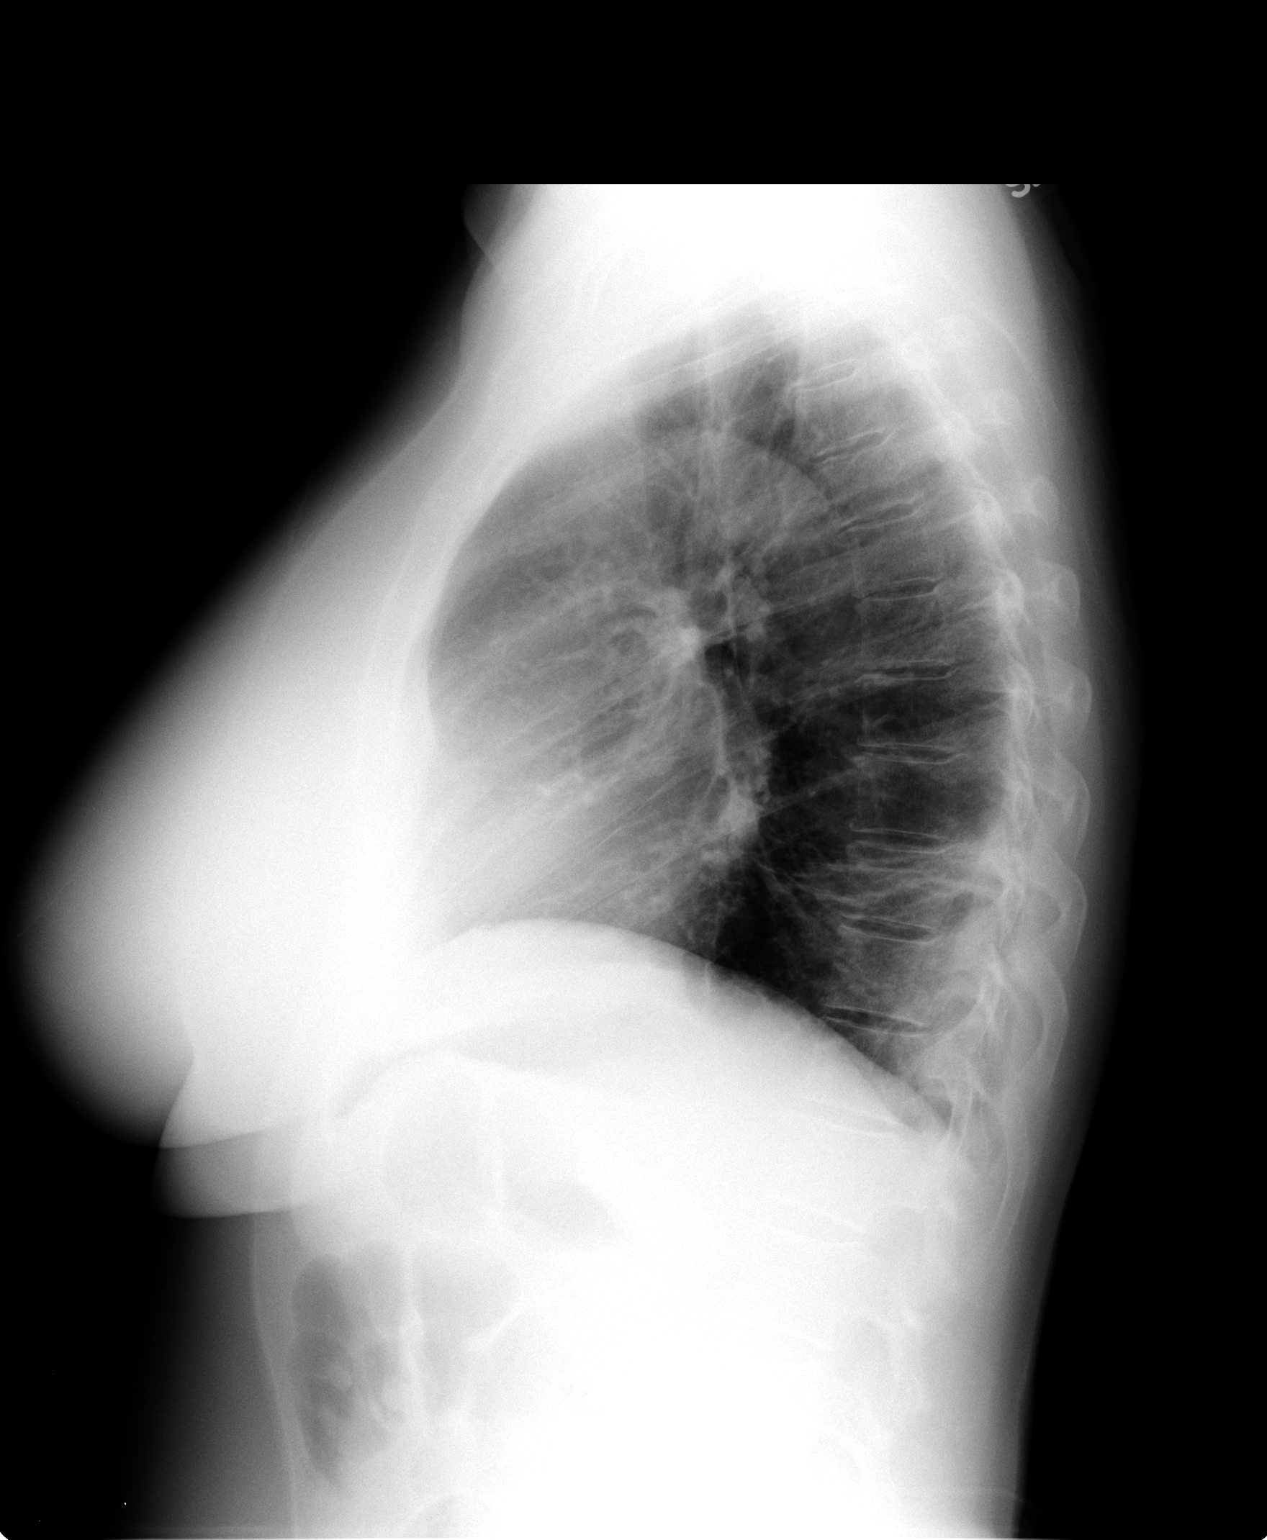

[2 of 2 positions shown; findings below may reference images not displayed]

FINDINGS: The heart size and vascular pattern are normal. Bilateral lower lobe
scarring and/or atelectasis similar but mildly improved when
compared to prior study. Mild pleural thickening and underlying
opacity posteriorly on the lateral radiograph
IMPRESSION: Small stable effusions or chronic pleural thickening with underlying
atelectasis. Mildly improved aeration at the lung bases when
compared to prior study.

## 2017-03-29 ENCOUNTER — Other Ambulatory Visit: Payer: Self-pay | Admitting: Obstetrics and Gynecology

## 2017-03-29 DIAGNOSIS — Z1231 Encounter for screening mammogram for malignant neoplasm of breast: Secondary | ICD-10-CM

## 2017-04-29 ENCOUNTER — Ambulatory Visit
Admission: RE | Admit: 2017-04-29 | Discharge: 2017-04-29 | Disposition: A | Discharge status: Home / Self Care | Payer: Medicare Other | Source: Ambulatory Visit | Attending: Obstetrics and Gynecology | Admitting: Obstetrics and Gynecology

## 2017-04-29 ENCOUNTER — Ambulatory Visit: Payer: Medicare Other

## 2017-04-29 DIAGNOSIS — Z1231 Encounter for screening mammogram for malignant neoplasm of breast: Secondary | ICD-10-CM

## 2018-02-09 ENCOUNTER — Other Ambulatory Visit: Payer: Self-pay | Admitting: Obstetrics and Gynecology

## 2018-02-09 DIAGNOSIS — Z1231 Encounter for screening mammogram for malignant neoplasm of breast: Secondary | ICD-10-CM

## 2018-03-18 ENCOUNTER — Other Ambulatory Visit: Payer: Self-pay

## 2018-03-18 ENCOUNTER — Emergency Department (HOSPITAL_COMMUNITY): Payer: Medicare HMO

## 2018-03-18 ENCOUNTER — Emergency Department (HOSPITAL_COMMUNITY)
Admission: EM | Admit: 2018-03-18 | Discharge: 2018-03-18 | Disposition: A | Discharge status: Home / Self Care | Payer: Medicare HMO | Attending: Emergency Medicine | Admitting: Emergency Medicine

## 2018-03-18 ENCOUNTER — Encounter (HOSPITAL_COMMUNITY): Payer: Self-pay | Admitting: *Deleted

## 2018-03-18 DIAGNOSIS — Z79899 Other long term (current) drug therapy: Secondary | ICD-10-CM | POA: Diagnosis not present

## 2018-03-18 DIAGNOSIS — M25562 Pain in left knee: Secondary | ICD-10-CM | POA: Diagnosis not present

## 2018-03-18 DIAGNOSIS — M25462 Effusion, left knee: Secondary | ICD-10-CM | POA: Insufficient documentation
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
## 2018-03-18 NOTE — ED Provider Notes (Signed)
Medical screening examination/treatment/procedure(s) were conducted as a shared visit with non-physician practitioner(s) and myself.  I personally evaluated the patient during the encounter.  Due to onset of left knee pain earlier today made better with ice.  On exam she has a large knee effusion without any erythema, warmth or induration.  She is afebrile.  No trauma she states no evidence of trauma on exam.  Low suspicion for septic joint at this time.  Suspect that some type of inflammatory effusion.  Will use anti-inflammatories for now and follow-up with orthopedics if not improving.   Merrily Pew, MD 03/19/18 1122
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
## 2018-03-18 NOTE — Discharge Instructions (Addendum)
Take tylenol and Advil as needed for pain. Take Pepcid with the Advil as needed. Follow up with Dr. Inda Merlin or return here as needed for worsening symptoms.

## 2018-03-28 DIAGNOSIS — R69 Illness, unspecified: Secondary | ICD-10-CM | POA: Diagnosis not present

## 2018-04-24 DIAGNOSIS — Z01419 Encounter for gynecological examination (general) (routine) without abnormal findings: Secondary | ICD-10-CM | POA: Diagnosis not present

## 2018-05-01 ENCOUNTER — Ambulatory Visit
Admission: RE | Admit: 2018-05-01 | Discharge: 2018-05-01 | Disposition: A | Discharge status: Home / Self Care | Payer: Medicare Other | Source: Ambulatory Visit | Attending: Obstetrics and Gynecology | Admitting: Obstetrics and Gynecology

## 2018-05-01 DIAGNOSIS — Z1231 Encounter for screening mammogram for malignant neoplasm of breast: Secondary | ICD-10-CM | POA: Diagnosis not present

## 2018-06-19 DIAGNOSIS — M810 Age-related osteoporosis without current pathological fracture: Secondary | ICD-10-CM | POA: Diagnosis not present

## 2018-06-19 DIAGNOSIS — E7800 Pure hypercholesterolemia, unspecified: Secondary | ICD-10-CM | POA: Diagnosis not present

## 2018-06-19 DIAGNOSIS — Z23 Encounter for immunization: Secondary | ICD-10-CM | POA: Diagnosis not present

## 2018-06-19 DIAGNOSIS — Z0000 Encounter for general adult medical examination without abnormal findings: Secondary | ICD-10-CM | POA: Diagnosis not present

## 2018-08-03 DIAGNOSIS — R69 Illness, unspecified: Secondary | ICD-10-CM | POA: Diagnosis not present

## 2018-09-25 DIAGNOSIS — E7800 Pure hypercholesterolemia, unspecified: Secondary | ICD-10-CM | POA: Diagnosis not present

## 2018-12-06 DIAGNOSIS — Z23 Encounter for immunization: Secondary | ICD-10-CM | POA: Diagnosis not present

## 2018-12-06 DIAGNOSIS — K625 Hemorrhage of anus and rectum: Secondary | ICD-10-CM | POA: Diagnosis not present

## 2019-02-21 DIAGNOSIS — K921 Melena: Secondary | ICD-10-CM | POA: Diagnosis not present

## 2019-03-21 DIAGNOSIS — R21 Rash and other nonspecific skin eruption: Secondary | ICD-10-CM | POA: Diagnosis not present

## 2019-04-04 DIAGNOSIS — Z09 Encounter for follow-up examination after completed treatment for conditions other than malignant neoplasm: Secondary | ICD-10-CM | POA: Diagnosis not present

## 2019-04-04 DIAGNOSIS — R21 Rash and other nonspecific skin eruption: Secondary | ICD-10-CM | POA: Diagnosis not present

## 2019-06-12 ENCOUNTER — Other Ambulatory Visit: Payer: Self-pay | Admitting: Obstetrics and Gynecology

## 2019-06-12 DIAGNOSIS — Z1231 Encounter for screening mammogram for malignant neoplasm of breast: Secondary | ICD-10-CM

## 2019-06-25 DIAGNOSIS — E7800 Pure hypercholesterolemia, unspecified: Secondary | ICD-10-CM | POA: Diagnosis not present

## 2019-06-25 DIAGNOSIS — M810 Age-related osteoporosis without current pathological fracture: Secondary | ICD-10-CM | POA: Diagnosis not present

## 2019-06-25 DIAGNOSIS — Z0000 Encounter for general adult medical examination without abnormal findings: Secondary | ICD-10-CM | POA: Diagnosis not present

## 2019-07-25 ENCOUNTER — Ambulatory Visit
Admission: RE | Admit: 2019-07-25 | Discharge: 2019-07-25 | Disposition: A | Discharge status: Home / Self Care | Payer: Medicare HMO | Source: Ambulatory Visit | Attending: Obstetrics and Gynecology | Admitting: Obstetrics and Gynecology

## 2019-07-25 ENCOUNTER — Other Ambulatory Visit: Payer: Self-pay

## 2019-07-25 DIAGNOSIS — Z1231 Encounter for screening mammogram for malignant neoplasm of breast: Secondary | ICD-10-CM | POA: Diagnosis not present

## 2019-07-30 ENCOUNTER — Other Ambulatory Visit: Payer: Self-pay | Admitting: Dermatology

## 2019-07-30 DIAGNOSIS — L820 Inflamed seborrheic keratosis: Secondary | ICD-10-CM | POA: Diagnosis not present

## 2019-07-30 DIAGNOSIS — D485 Neoplasm of uncertain behavior of skin: Secondary | ICD-10-CM | POA: Diagnosis not present

## 2019-07-30 DIAGNOSIS — L570 Actinic keratosis: Secondary | ICD-10-CM | POA: Diagnosis not present

## 2019-07-30 DIAGNOSIS — D229 Melanocytic nevi, unspecified: Secondary | ICD-10-CM | POA: Diagnosis not present

## 2019-08-18 DIAGNOSIS — R69 Illness, unspecified: Secondary | ICD-10-CM | POA: Diagnosis not present

## 2019-08-20 DIAGNOSIS — Z23 Encounter for immunization: Secondary | ICD-10-CM | POA: Diagnosis not present

## 2019-12-01 ENCOUNTER — Ambulatory Visit: Payer: Medicare Other | Attending: Internal Medicine

## 2019-12-01 DIAGNOSIS — Z23 Encounter for immunization: Secondary | ICD-10-CM | POA: Insufficient documentation

## 2019-12-01 NOTE — Progress Notes (Signed)
   Covid-19 Vaccination Clinic  Name:  Ashley Gallegos    MRN: [id number] DOB: [birth year]  12/01/2019  Ashley Gallegos was observed post Covid-19 immunization for 15 minutes without incidence. She was provided with Vaccine Information Sheet and instruction to access the V-Safe system.   Ashley Gallegos was instructed to call 911 with any severe reactions post vaccine: Marland Kitchen Difficulty breathing  . Swelling of your face and throat  . A fast heartbeat  . A bad rash all over your body  . Dizziness and weakness    Immunizations Administered    Name Date Dose VIS Date Route   Pfizer COVID-19 Vaccine 12/01/2019 12:53 PM 0.3 mL 10/19/2019 Intramuscular   Manufacturer: [place]   Lot: [id number]   [place]: [id number]

## 2019-12-06 ENCOUNTER — Ambulatory Visit: Payer: Medicare HMO

## 2019-12-22 ENCOUNTER — Ambulatory Visit: Payer: Medicare Other | Attending: Internal Medicine

## 2019-12-22 DIAGNOSIS — Z23 Encounter for immunization: Secondary | ICD-10-CM

## 2019-12-22 NOTE — Progress Notes (Signed)
   Covid-19 Vaccination Clinic  Name:  Ashley Gallegos    MRN: [id number] DOB: August 09, 1946  12/22/2019  [name] was observed post Covid-19 immunization for 15 minutes without incidence. She was provided with Vaccine Information Sheet and instruction to access the V-Safe system.   [name] was instructed to call 911 with any severe reactions post vaccine: Marland Kitchen Difficulty breathing  . Swelling of your face and throat  . A fast heartbeat  . A bad rash all over your body  . Dizziness and weakness    Immunizations Administered    Name Date Dose VIS Date Route   Pfizer COVID-19 Vaccine 12/22/2019 11:14 AM 0.3 mL 10/19/2019 Intramuscular   Manufacturer: [place]   Lot: [id number]   [place]: [id number]

## 2019-12-27 ENCOUNTER — Ambulatory Visit: Payer: Medicare HMO

## 2020-04-25 ENCOUNTER — Other Ambulatory Visit: Payer: Self-pay | Admitting: Obstetrics and Gynecology

## 2020-04-25 DIAGNOSIS — Z1231 Encounter for screening mammogram for malignant neoplasm of breast: Secondary | ICD-10-CM

## 2020-07-25 ENCOUNTER — Ambulatory Visit
Admission: RE | Admit: 2020-07-25 | Discharge: 2020-07-25 | Disposition: A | Discharge status: Home / Self Care | Payer: Medicare Other | Source: Ambulatory Visit | Attending: Obstetrics and Gynecology | Admitting: Obstetrics and Gynecology

## 2020-07-25 ENCOUNTER — Other Ambulatory Visit: Payer: Self-pay

## 2020-07-25 DIAGNOSIS — Z1231 Encounter for screening mammogram for malignant neoplasm of breast: Secondary | ICD-10-CM

## 2020-09-15 ENCOUNTER — Encounter: Payer: Self-pay | Admitting: Dermatology

## 2020-09-15 ENCOUNTER — Ambulatory Visit (INDEPENDENT_AMBULATORY_CARE_PROVIDER_SITE_OTHER): Payer: Medicare Other | Admitting: Dermatology

## 2020-09-15 ENCOUNTER — Other Ambulatory Visit: Payer: Self-pay

## 2020-09-15 DIAGNOSIS — Z86018 Personal history of other benign neoplasm: Secondary | ICD-10-CM

## 2020-09-15 DIAGNOSIS — D485 Neoplasm of uncertain behavior of skin: Secondary | ICD-10-CM | POA: Diagnosis not present

## 2020-09-15 DIAGNOSIS — L820 Inflamed seborrheic keratosis: Secondary | ICD-10-CM

## 2020-09-15 DIAGNOSIS — Z1283 Encounter for screening for malignant neoplasm of skin: Secondary | ICD-10-CM

## 2020-09-15 DIAGNOSIS — L821 Other seborrheic keratosis: Secondary | ICD-10-CM

## 2020-09-15 NOTE — Patient Instructions (Signed)

## 2020-09-29 ENCOUNTER — Encounter: Payer: Self-pay | Admitting: Dermatology

## 2020-09-29 NOTE — Progress Notes (Signed)
   Follow-Up Visit   Subjective  Ashley Gallegos is a 74 y.o. female who presents for the following: Annual Exam (Patient here today for skin check. Patient has a spot on her left hand x years change in shape, color and size. Check spots under both breast x years itching. Thicken flat place in right hairline x years getting larger.).  Change in spot Location: Left hand Duration:  Quality: Larger darker Associated Signs/Symptoms: Modifying Factors:  Severity:  Timing: Context:   Objective  Well appearing patient in no apparent distress; mood and affect are within normal limits.  A full examination was performed including scalp, head, eyes, ears, nose, lips, neck, chest, axillae, abdomen, back, buttocks, bilateral upper extremities, bilateral lower extremities, hands, feet, fingers, toes, fingernails, and toenails. All findings within normal limits unless otherwise noted below.   Assessment & Plan    Skin exam for malignant neoplasm Head - Anterior (Face)  Annual skin examination.  History of atypical skin mole (3) Left Breast; Left Inframammary Fold; Left 2nd Dorsal Mid Toe  Self examine skin twice annually  Seborrheic keratosis (3) Right Abdomen (side) - Upper; Left Inframammary Fold; Right Frontal Scalp  Lesion prone to irritation so patient prefers treatment; liquid nitrogen freeze x5 seconds done.  Destruction of lesion - Left Inframammary Fold Complexity: simple   Destruction method: cryotherapy   Informed consent: discussed and consent obtained   Timeout:  patient name, date of birth, surgical site, and procedure verified Lesion destroyed using liquid nitrogen: Yes   Cryotherapy cycles:  5 Outcome: patient tolerated procedure well with no complications   Post-procedure details: wound care instructions given    Neoplasm of uncertain behavior of skin Left Thumb Web  Skin / nail biopsy Type of biopsy: tangential   Informed consent: discussed and consent obtained    Timeout: patient name, date of birth, surgical site, and procedure verified   Procedure prep:  Patient was prepped and draped in usual sterile fashion (Non sterile) Prep type:  Chlorhexidine Anesthesia: the lesion was anesthetized in a standard fashion   Anesthetic:  1% lidocaine w/ epinephrine 1-100,000 local infiltration Instrument used: flexible razor blade   Outcome: patient tolerated procedure well   Post-procedure details: wound care instructions given    Specimen 1 - Surgical pathology Differential Diagnosis: R/O Atypia Check Margins: No     I, Lavonna Monarch, MD, have reviewed all documentation for this visit.  The documentation on 09/29/20 for the exam, diagnosis, procedures, and orders are all accurate and complete.

## 2021-03-07 ENCOUNTER — Emergency Department (HOSPITAL_BASED_OUTPATIENT_CLINIC_OR_DEPARTMENT_OTHER): Payer: Medicare Other

## 2021-03-07 ENCOUNTER — Emergency Department (HOSPITAL_BASED_OUTPATIENT_CLINIC_OR_DEPARTMENT_OTHER)
Admission: EM | Admit: 2021-03-07 | Discharge: 2021-03-07 | Disposition: A | Discharge status: Home / Self Care | Payer: Medicare Other | Attending: Emergency Medicine | Admitting: Emergency Medicine

## 2021-03-07 ENCOUNTER — Other Ambulatory Visit: Payer: Self-pay

## 2021-03-07 ENCOUNTER — Encounter (HOSPITAL_BASED_OUTPATIENT_CLINIC_OR_DEPARTMENT_OTHER): Payer: Self-pay | Admitting: *Deleted

## 2021-03-07 DIAGNOSIS — S0990XA Unspecified injury of head, initial encounter: Secondary | ICD-10-CM | POA: Diagnosis present

## 2021-03-07 DIAGNOSIS — S0081XA Abrasion of other part of head, initial encounter: Secondary | ICD-10-CM | POA: Diagnosis not present

## 2021-03-07 DIAGNOSIS — W108XXA Fall (on) (from) other stairs and steps, initial encounter: Secondary | ICD-10-CM | POA: Insufficient documentation

## 2021-03-07 DIAGNOSIS — S0031XA Abrasion of nose, initial encounter: Secondary | ICD-10-CM | POA: Diagnosis not present

## 2021-03-07 DIAGNOSIS — W19XXXA Unspecified fall, initial encounter: Secondary | ICD-10-CM

## 2021-03-07 DIAGNOSIS — Y92194 Driveway of other specified residential institution as the place of occurrence of the external cause: Secondary | ICD-10-CM | POA: Insufficient documentation

## 2021-03-07 DIAGNOSIS — M25511 Pain in right shoulder: Secondary | ICD-10-CM | POA: Diagnosis not present

## 2021-03-07 HISTORY — DX: Unspecified glaucoma: H40.9

## 2021-03-07 NOTE — Discharge Instructions (Addendum)
You can take 600 mg of ibuprofen every 6 hours, you can take 1000 mg of Tylenol every 6 hours, you can alternate these every 3 or you can take them together.  

## 2021-03-07 NOTE — ED Triage Notes (Signed)
Pt reports she missed a step and fell landing on her face on the driveway. Denies LOC. No blood thinners. Alert. Abrasions noted to face, both palms bruised and c/o pain in right shoulder

## 2021-03-07 NOTE — ED Notes (Signed)
Tripped on steps and fell onto concrete landing on right arm and face.  Abrasion and swelling to mid forehead and nose.

## 2021-03-07 NOTE — ED Provider Notes (Signed)
[facility] Provider Note   CSN: [id number] Arrival date & time: 03/07/21  1724     History Chief Complaint  Patient presents with  . Fall    Ashley Gallegos is a 75 y.o. female.  Mechanical fall.  Abrasion to the face.  No loss of consciousness.  Pain in the face pain in the head.  Pain in the right shoulder.  Pain is mild to moderate.  No medications tried.  Worse with movement worse with touching wounds.  Better with rest.  No previous injuries to the area.  No blood thinners.        Past Medical History:  Diagnosis Date  . Atypical nevus; atypical melanocytic 05/18/2004   left medial second toe  . Atypical nevus; moderate 07/26/2005   left breast  . Atypical nevus; severe melanocytic 09/03/2013   under left breast - excision 10/15/2013  . Atypical nevus; slight 05/18/2004   left breast - wider shave done 07/26/2005  . Glaucoma     Patient Active Problem List   Diagnosis Date Noted  . Cough 02/21/2015  . SOB (shortness of breath) 10/12/2012  . Chest pain 10/12/2012  . Pleuritis 10/12/2012    Past Surgical History:  Procedure Laterality Date  . BREAST EXCISIONAL BIOPSY Right    x 2  . FOOT SURGERY       OB History   No obstetric history on file.     Family History  Problem Relation Age of Onset  . Breast cancer Mother     Social History   Tobacco Use  . Smoking status: Never Smoker  . Smokeless tobacco: Never Used  Substance Use Topics  . Alcohol use: No  . Drug use: No    Home Medications Prior to Admission medications   Medication Sig Start Date End Date Taking? Authorizing Provider  chlorpheniramine (CHLOR-TRIMETON) 4 MG tablet Take 8 mg by mouth at bedtime. Patient not taking: Reported on 09/15/2020    [provider]  dextromethorphan (DELSYM) 30 MG/5ML liquid 2 tsp every 12 hours as needed for cough Patient not taking: Reported on 09/15/2020    [provider]  famotidine (PEPCID) 20  MG tablet Take 20 mg by mouth at bedtime. Patient not taking: Reported on 09/15/2020    [provider]  ibuprofen (ADVIL,MOTRIN) 200 MG tablet Take 200 mg by mouth every 6 (six) hours as needed. Patient not taking: Reported on 09/15/2020    [provider]  Multiple Vitamins-Minerals (MULTIVITAMIN WITH MINERALS) tablet Take 1 tablet by mouth daily.     [provider]  pantoprazole (PROTONIX) 40 MG tablet Take 1 tablet (40 mg total) by mouth daily. Patient not taking: Reported on 09/15/2020 02/24/15   Tanda Rockers, MD  predniSONE (DELTASONE) 10 MG tablet Take  4 each am x 2 days,   2 each am x 2 days,  1 each am x 2 days and stop Patient not taking: Reported on 09/15/2020 03/21/15   Tanda Rockers, MD    Allergies    Omnipaque [iohexol], Amoxicillin, Biaxin [clarithromycin], Codeine, Darvon [propoxyphene hcl], Demerol [meperidine], and Hydrocodone  Review of Systems   Review of Systems  Constitutional: Negative for chills and fever.  HENT: Negative for congestion and rhinorrhea.   Respiratory: Negative for cough and shortness of breath.   Cardiovascular: Negative for chest pain and palpitations.  Gastrointestinal: Negative for diarrhea, nausea and vomiting.  Genitourinary: Negative for difficulty urinating and dysuria.  Musculoskeletal: Negative for  arthralgias and back pain.  Skin: Positive for wound. Negative for rash.  Neurological: Positive for headaches. Negative for light-headedness.    Physical Exam Updated Vital Signs BP (!) 159/72   Pulse 65   Temp 98.7 F (37.1 C) (Oral)   Resp 20   Ht 5' (1.524 m)   Wt 57.6 kg   SpO2 97%   BMI 24.80 kg/m   Physical Exam Vitals and nursing note reviewed. Exam conducted with a chaperone present.  Constitutional:      General: She is not in acute distress.    Appearance: Normal appearance.  HENT:     Head: Normocephalic.     Comments: Mid forehead and nose abrasion.  Hemostatic.  No laceration.  No  septal hematoma.  No ocular entrapment.    Nose: No rhinorrhea.  Eyes:     General:        Right eye: No discharge.        Left eye: No discharge.     Extraocular Movements: Extraocular movements intact.     Conjunctiva/sclera: Conjunctivae normal.     Pupils: Pupils are equal, round, and reactive to light.  Cardiovascular:     Rate and Rhythm: Normal rate and regular rhythm.  Pulmonary:     Effort: Pulmonary effort is normal. No respiratory distress.     Breath sounds: No stridor.  Abdominal:     General: Abdomen is flat. There is no distension.     Palpations: Abdomen is soft.  Musculoskeletal:        General: Tenderness present. No signs of injury.     Comments: Mild tenderness of the right shoulder joint specifically about the biceps brachii tendon.  Normal range of motion neurovascular intact distal no deformity  Skin:    General: Skin is warm and dry.  Neurological:     General: No focal deficit present.     Mental Status: She is alert. Mental status is at baseline.     Cranial Nerves: No cranial nerve deficit.     Sensory: No sensory deficit.     Motor: No weakness.     Gait: Gait normal.  Psychiatric:        Mood and Affect: Mood normal.        Behavior: Behavior normal.     ED Results / Procedures / Treatments   Labs (all labs ordered are listed, but only abnormal results are displayed) Labs Reviewed - No data to display  EKG None  Radiology DG Shoulder Right  Result Date: 03/07/2021 CLINICAL DATA:  Pain status post fall EXAM: RIGHT SHOULDER - 2+ VIEW COMPARISON:  None. FINDINGS: There is no evidence of fracture or dislocation. There is no evidence of arthropathy or other focal bone abnormality. Soft tissues are unremarkable. IMPRESSION: Negative. Electronically Signed   By: Constance Holster M.D.   On: 03/07/2021 22:03   CT Head Wo Contrast  Result Date: 03/07/2021 CLINICAL DATA:  Head trauma. EXAM: CT HEAD WITHOUT CONTRAST TECHNIQUE: Contiguous axial  images were obtained from the base of the skull through the vertex without intravenous contrast. COMPARISON:  None. FINDINGS: Brain: No evidence of acute infarction, hemorrhage, hydrocephalus, extra-axial collection or mass lesion/mass effect. There is generalized age related atrophy and chronic microvascular ischemic changes bilaterally. Vascular: No hyperdense vessel or unexpected calcification. Skull: There is frontal scalp swelling without evidence for an underlying fracture. Sinuses/Orbits: No acute finding. Other: None. IMPRESSION: Frontal scalp swelling without evidence for an acute intracranial abnormality. Electronically Signed  By: Constance Holster M.D.   On: 03/07/2021 22:02    Procedures Procedures   Medications Ordered in ED Medications - No data to display  ED Course  I have reviewed the triage vital signs and the nursing notes.  Pertinent labs & imaging results that were available during my care of the patient were reviewed by me and considered in my medical decision making (see chart for details).    MDM Rules/Calculators/A&P                          Mechanical fall downstairs.  Has abrasions to the face and nose.  No signs of life-threatening injury normal neurologic exam.  No signs of significant facial trauma.  She will get a CT head she will get a right shoulder x-ray.  She is neurovascular intact in all extremities.  Likely just supportive care local wound care and discharge home.  No wounds that need repair.  Tetanus is up-to-date.  Pain control offered and declined by patient.  CT imaging reviewed by myself and radiology as well as x-ray both negative.  Patient feels comfortable discharge home wound is clean and topical antibiotics applied.  Wound care instructions given return precautions discussed. Final Clinical Impression(s) / ED Diagnoses Final diagnoses:  Fall, initial encounter  Abrasion of face, initial encounter  Acute pain of right shoulder    Rx / DC  Orders ED Discharge Orders    None       Breck Coons, MD 03/09/21 [phone]

## 2021-04-18 IMAGING — MG MM DIGITAL SCREENING BILAT W/ TOMO W/ CAD
8 series · 8 of 24 positions shown · non-contrast
Comparison: Previous exam(s).

CLINICAL DATA: Screening.

EXAM:
DIGITAL SCREENING BILATERAL MAMMOGRAM WITH TOMO AND CAD

[L CC synth-2D]
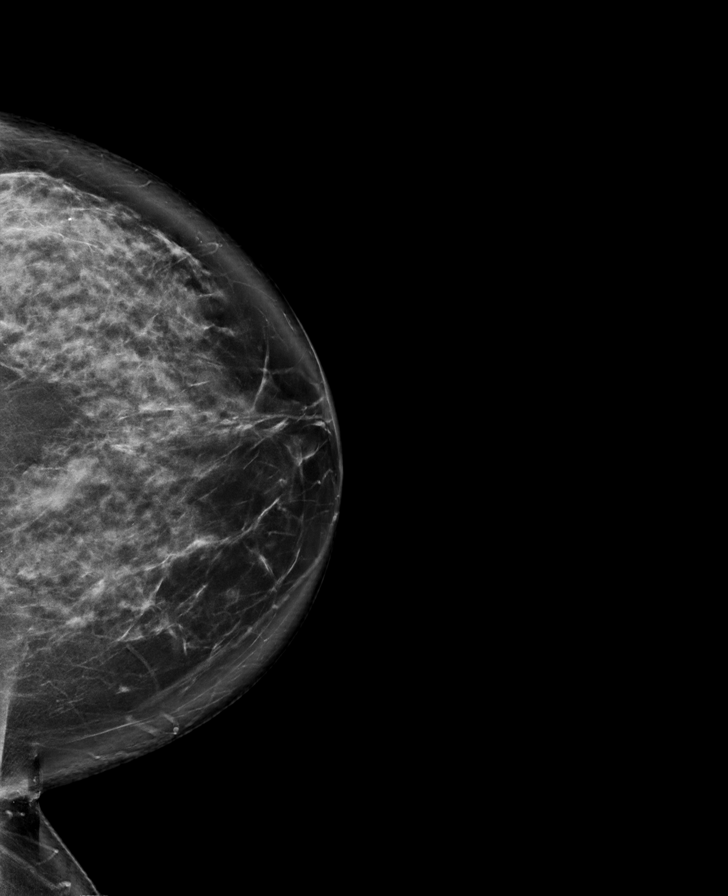

[R MLO synth-2D]
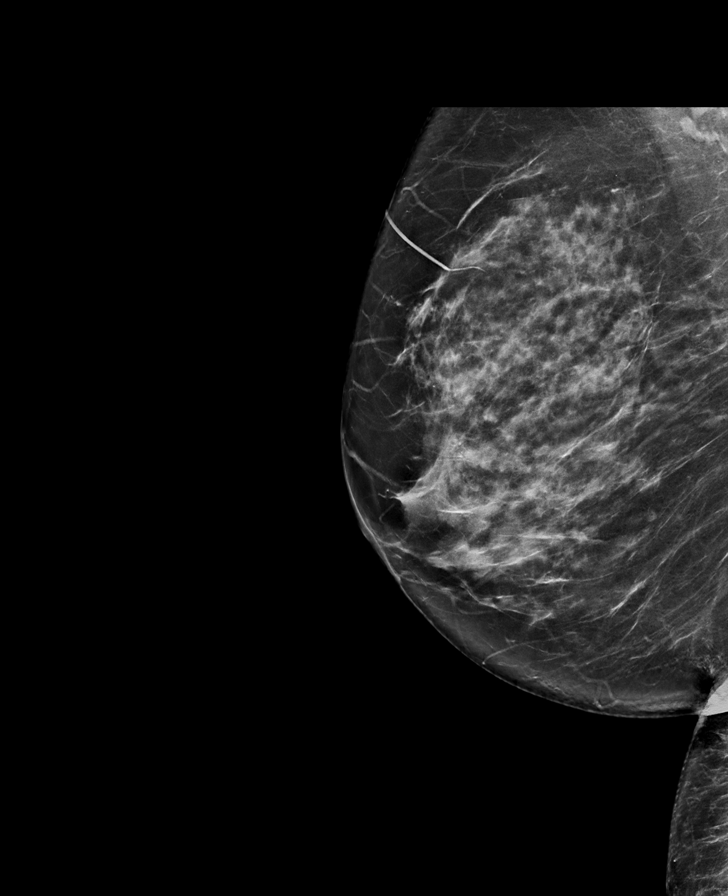

[L MLO synth-2D]
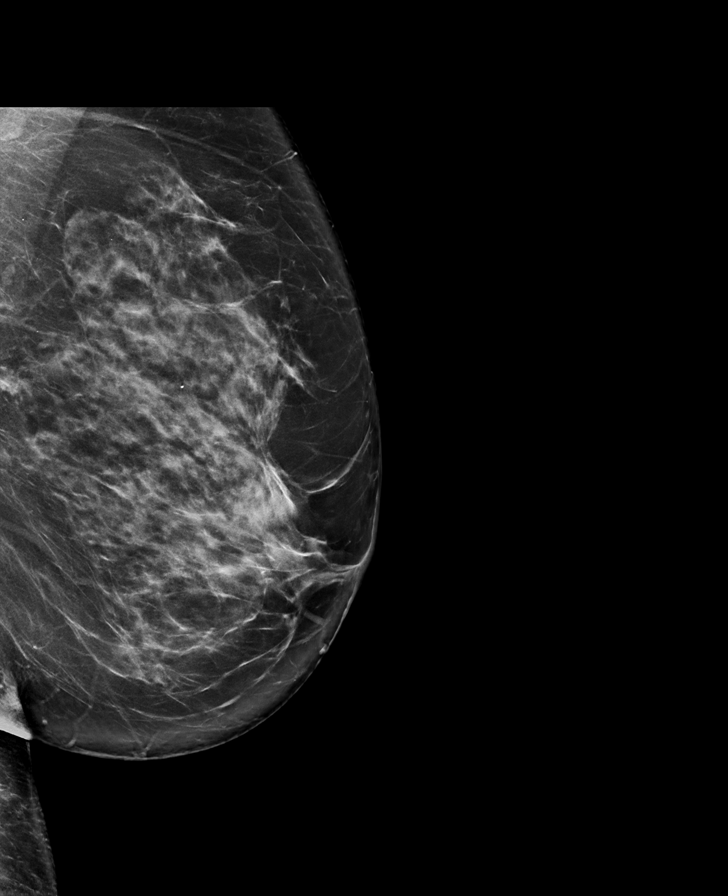

[R CC synth-2D]
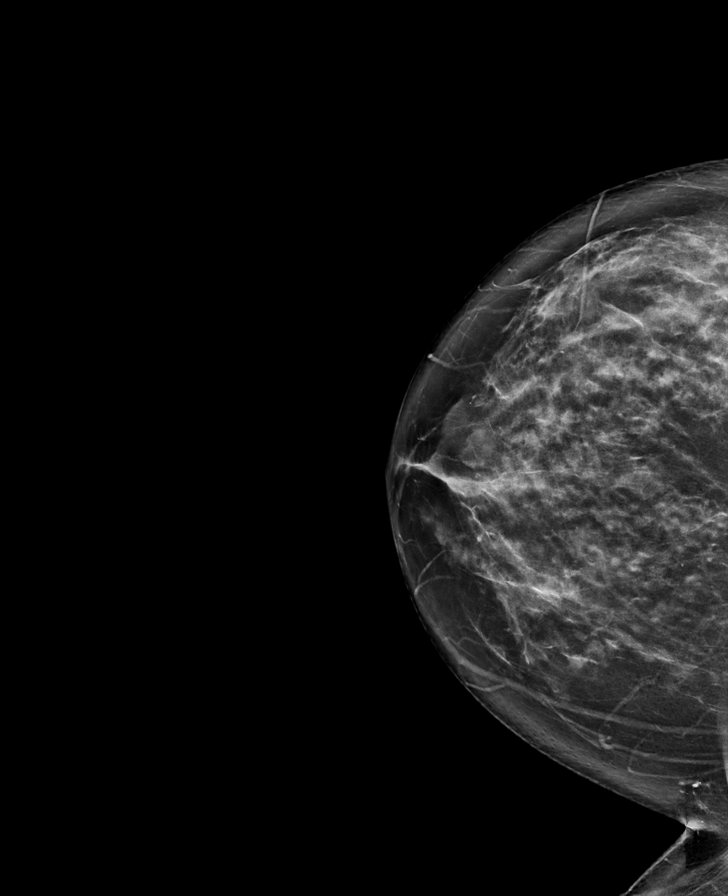

[R MLO tomo · tomo slice 44/87.0]
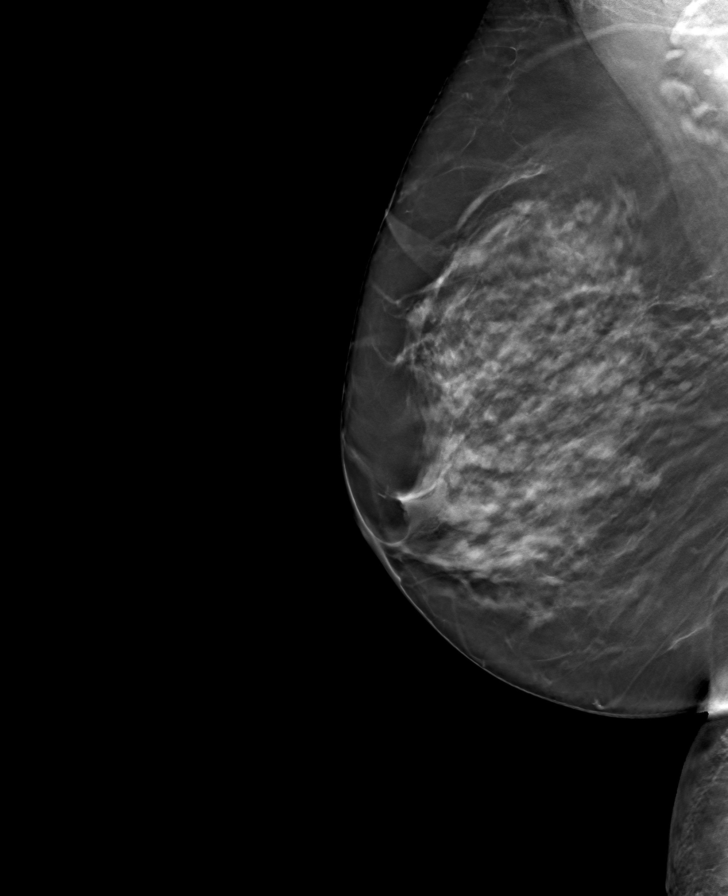

[L MLO tomo · tomo slice 46/91.0]
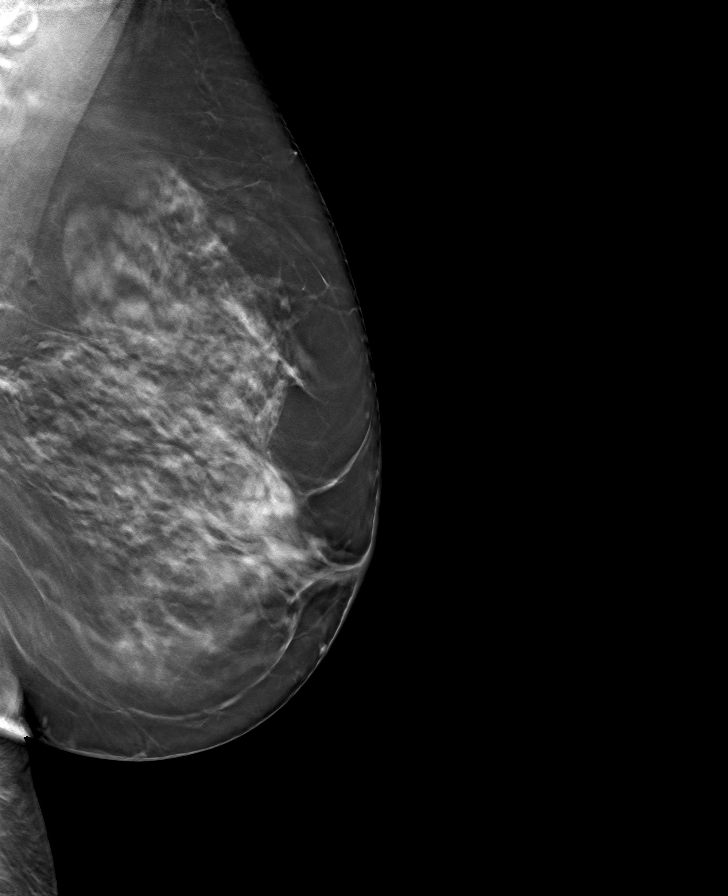

[L CC tomo · tomo slice 49/98.0]
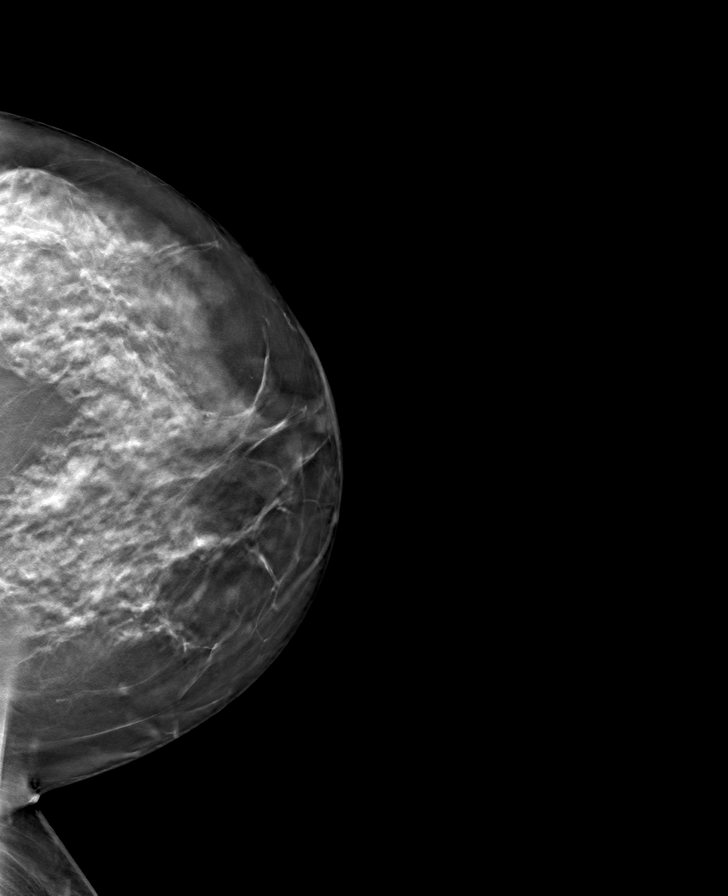

[R CC tomo · tomo slice 46/91.0]
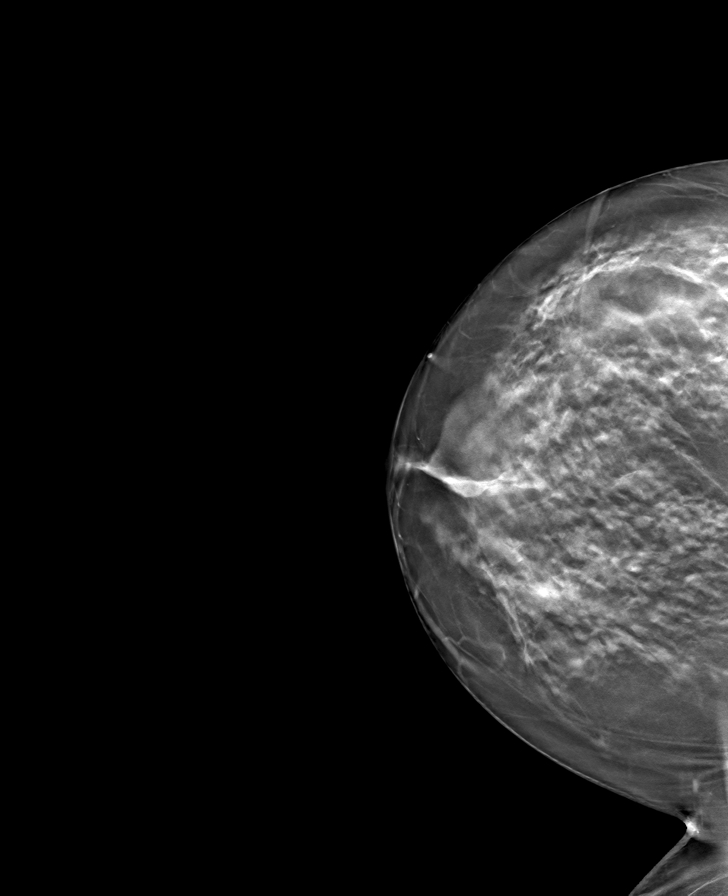

[8 of 24 positions shown; findings below may reference images not displayed]

ACR Breast Density Category c: The breast tissue is heterogeneously
dense, which may obscure small masses.
FINDINGS: There are no findings suspicious for malignancy. Images were
processed with CAD.
IMPRESSION: No mammographic evidence of malignancy. A result letter of this
screening mammogram will be mailed directly to the patient.

RECOMMENDATION:
Screening mammogram in one year. (Code:[id number]-U-LHB)

BI-RADS CATEGORY  1: Negative.

## 2021-05-29 ENCOUNTER — Other Ambulatory Visit: Payer: Self-pay | Admitting: Obstetrics and Gynecology

## 2021-05-29 DIAGNOSIS — Z1231 Encounter for screening mammogram for malignant neoplasm of breast: Secondary | ICD-10-CM

## 2021-07-15 ENCOUNTER — Other Ambulatory Visit: Payer: Self-pay | Admitting: Family Medicine

## 2021-07-15 ENCOUNTER — Other Ambulatory Visit (HOSPITAL_COMMUNITY): Payer: Self-pay | Admitting: Family Medicine

## 2021-07-15 DIAGNOSIS — R2241 Localized swelling, mass and lump, right lower limb: Secondary | ICD-10-CM

## 2021-07-29 ENCOUNTER — Ambulatory Visit
Admission: RE | Admit: 2021-07-29 | Discharge: 2021-07-29 | Disposition: A | Discharge status: Home / Self Care | Payer: Medicare Other | Source: Ambulatory Visit | Attending: Obstetrics and Gynecology | Admitting: Obstetrics and Gynecology

## 2021-07-29 ENCOUNTER — Other Ambulatory Visit: Payer: Self-pay

## 2021-07-29 DIAGNOSIS — Z1231 Encounter for screening mammogram for malignant neoplasm of breast: Secondary | ICD-10-CM

## 2021-08-04 ENCOUNTER — Other Ambulatory Visit: Payer: Medicare Other

## 2021-08-12 ENCOUNTER — Other Ambulatory Visit: Payer: Self-pay

## 2021-08-12 ENCOUNTER — Ambulatory Visit (HOSPITAL_COMMUNITY)
Admission: RE | Admit: 2021-08-12 | Discharge: 2021-08-12 | Disposition: A | Discharge status: Home / Self Care | Payer: Self-pay | Source: Ambulatory Visit | Attending: Family Medicine | Admitting: Family Medicine

## 2021-08-12 DIAGNOSIS — E7800 Pure hypercholesterolemia, unspecified: Secondary | ICD-10-CM | POA: Insufficient documentation

## 2021-08-12 DIAGNOSIS — I517 Cardiomegaly: Secondary | ICD-10-CM | POA: Insufficient documentation

## 2021-08-18 ENCOUNTER — Ambulatory Visit
Admission: RE | Admit: 2021-08-18 | Discharge: 2021-08-18 | Disposition: A | Discharge status: Home / Self Care | Payer: Medicare Other | Source: Ambulatory Visit | Attending: Family Medicine | Admitting: Family Medicine

## 2021-08-18 DIAGNOSIS — R2241 Localized swelling, mass and lump, right lower limb: Secondary | ICD-10-CM

## 2021-09-15 ENCOUNTER — Ambulatory Visit: Payer: Medicare Other | Admitting: Dermatology

## 2021-12-02 ENCOUNTER — Other Ambulatory Visit: Payer: Self-pay

## 2021-12-02 ENCOUNTER — Ambulatory Visit (INDEPENDENT_AMBULATORY_CARE_PROVIDER_SITE_OTHER): Payer: Medicare Other | Admitting: Dermatology

## 2021-12-02 DIAGNOSIS — L719 Rosacea, unspecified: Secondary | ICD-10-CM | POA: Diagnosis not present

## 2021-12-02 DIAGNOSIS — L821 Other seborrheic keratosis: Secondary | ICD-10-CM

## 2021-12-02 DIAGNOSIS — L738 Other specified follicular disorders: Secondary | ICD-10-CM | POA: Diagnosis not present

## 2021-12-02 DIAGNOSIS — Z1283 Encounter for screening for malignant neoplasm of skin: Secondary | ICD-10-CM | POA: Diagnosis not present

## 2021-12-02 MED ORDER — METRONIDAZOLE 0.75 % EX CREA: TOPICAL_CREAM | Freq: Two times a day (BID) | CUTANEOUS | 2 refills | Status: AC

## 2021-12-25 ENCOUNTER — Encounter: Payer: Self-pay | Admitting: Dermatology

## 2021-12-25 NOTE — Progress Notes (Signed)
° °  Follow-Up Visit   Subjective  Ashley Gallegos is a 76 y.o. female who presents for the following: Annual Exam (Pt here for annual. Pt states she has been breaking out on the B/L cheeks. Spots of concern on the Mid Back and chest).  General skin examination, several concerns Location:  Duration:  Quality:  Associated Signs/Symptoms: Modifying Factors:  Severity:  Timing: Context:   Objective  Well appearing patient in no apparent distress; mood and affect are within normal limits. Scalp General skin examination: No atypical pigmented lesions or nonmelanoma skin cancer  Right Forehead 2 mm flesh-colored eccentrically umbilicated papules with compatible dermoscopy  Head - Anterior (Face) Central facial inflammatory papules  Abdomen (Lower Torso, Anterior), Left Forehead Flattopped textured tan 4 to 8 mm papules    A full examination was performed including scalp, head, eyes, ears, nose, lips, neck, chest, axillae, abdomen, back, buttocks, bilateral upper extremities, bilateral lower extremities, hands, feet, fingers, toes, fingernails, and toenails. All findings within normal limits unless otherwise noted below.  Areas beneath undergarments not fully examined   Assessment & Plan    Screening exam for skin cancer Scalp  Annual skin examination, encouraged to self examine twice annually  Sebaceous hyperplasia of face Right Forehead  Told of similar appearance of early BCC so if there is growth or bleeding return for biopsy  Rosacea Head - Anterior (Face)  Topical metronidazole nightly for 6 weeks then contact office with status report  metroNIDAZOLE (METROCREAM) 0.75 % cream - Head - Anterior (Face) Apply topically 2 (two) times daily.  Seborrheic keratosis (2) Abdomen (Lower Torso, Anterior); Left Forehead  Leave if stable      I, Lavonna Monarch, MD, have reviewed all documentation for this visit.  The documentation on 12/25/21 for the exam, diagnosis,  procedures, and orders are all accurate and complete.

## 2022-07-15 ENCOUNTER — Other Ambulatory Visit: Payer: Self-pay | Admitting: Obstetrics and Gynecology

## 2022-07-15 ENCOUNTER — Other Ambulatory Visit (HOSPITAL_COMMUNITY)
Admission: RE | Admit: 2022-07-15 | Discharge: 2022-07-15 | Disposition: A | Discharge status: Home / Self Care | Payer: Medicare Other | Source: Ambulatory Visit | Attending: Obstetrics and Gynecology | Admitting: Obstetrics and Gynecology

## 2022-07-15 DIAGNOSIS — Z1151 Encounter for screening for human papillomavirus (HPV): Secondary | ICD-10-CM | POA: Diagnosis not present

## 2022-07-15 DIAGNOSIS — Z01419 Encounter for gynecological examination (general) (routine) without abnormal findings: Secondary | ICD-10-CM | POA: Insufficient documentation

## 2022-07-22 LAB — CYTOLOGY - PAP
Comment: NEGATIVE
Diagnosis: NEGATIVE
High risk HPV: NEGATIVE

## 2022-08-02 ENCOUNTER — Other Ambulatory Visit: Payer: Self-pay | Admitting: Family Medicine

## 2022-08-02 DIAGNOSIS — Z1231 Encounter for screening mammogram for malignant neoplasm of breast: Secondary | ICD-10-CM

## 2022-08-03 ENCOUNTER — Ambulatory Visit
Admission: RE | Admit: 2022-08-03 | Discharge: 2022-08-03 | Disposition: A | Discharge status: Home / Self Care | Payer: Medicare Other | Source: Ambulatory Visit | Attending: Family Medicine | Admitting: Family Medicine

## 2022-08-03 ENCOUNTER — Ambulatory Visit: Payer: Medicare Other

## 2022-08-03 DIAGNOSIS — Z1231 Encounter for screening mammogram for malignant neoplasm of breast: Secondary | ICD-10-CM

## 2022-12-06 ENCOUNTER — Ambulatory Visit: Payer: Medicare Other | Admitting: Dermatology

## 2023-04-12 ENCOUNTER — Other Ambulatory Visit: Payer: Self-pay | Admitting: Family Medicine

## 2023-04-12 DIAGNOSIS — Z1231 Encounter for screening mammogram for malignant neoplasm of breast: Secondary | ICD-10-CM

## 2023-08-05 ENCOUNTER — Ambulatory Visit: Payer: Medicare Other

## 2023-08-10 ENCOUNTER — Ambulatory Visit
Admission: RE | Admit: 2023-08-10 | Discharge: 2023-08-10 | Disposition: A | Discharge status: Home / Self Care | Payer: Medicare Other | Source: Ambulatory Visit | Attending: Family Medicine | Admitting: Family Medicine

## 2023-08-10 DIAGNOSIS — Z1231 Encounter for screening mammogram for malignant neoplasm of breast: Secondary | ICD-10-CM

## 2023-12-22 ENCOUNTER — Other Ambulatory Visit: Payer: Self-pay | Admitting: Family Medicine

## 2023-12-22 DIAGNOSIS — Z1231 Encounter for screening mammogram for malignant neoplasm of breast: Secondary | ICD-10-CM

## 2024-08-10 ENCOUNTER — Ambulatory Visit
Admission: RE | Admit: 2024-08-10 | Discharge: 2024-08-10 | Disposition: A | Discharge status: Home / Self Care | Payer: Medicare Other | Source: Ambulatory Visit | Attending: Family Medicine | Admitting: Family Medicine

## 2024-08-10 DIAGNOSIS — Z1231 Encounter for screening mammogram for malignant neoplasm of breast: Secondary | ICD-10-CM

## 2024-09-25 ENCOUNTER — Emergency Department (HOSPITAL_BASED_OUTPATIENT_CLINIC_OR_DEPARTMENT_OTHER)

## 2024-09-25 ENCOUNTER — Other Ambulatory Visit: Payer: Self-pay

## 2024-09-25 ENCOUNTER — Emergency Department (HOSPITAL_BASED_OUTPATIENT_CLINIC_OR_DEPARTMENT_OTHER)
Admission: EM | Admit: 2024-09-25 | Discharge: 2024-09-25 | Disposition: A | Discharge status: Home / Self Care | Attending: Emergency Medicine | Admitting: Emergency Medicine

## 2024-09-25 DIAGNOSIS — Z23 Encounter for immunization: Secondary | ICD-10-CM | POA: Insufficient documentation

## 2024-09-25 DIAGNOSIS — S8002XA Contusion of left knee, initial encounter: Secondary | ICD-10-CM | POA: Diagnosis not present

## 2024-09-25 DIAGNOSIS — R519 Headache, unspecified: Secondary | ICD-10-CM | POA: Diagnosis present

## 2024-09-25 DIAGNOSIS — W109XXA Fall (on) (from) unspecified stairs and steps, initial encounter: Secondary | ICD-10-CM | POA: Diagnosis not present

## 2024-09-25 DIAGNOSIS — M25511 Pain in right shoulder: Secondary | ICD-10-CM | POA: Insufficient documentation

## 2024-09-25 DIAGNOSIS — S0181XA Laceration without foreign body of other part of head, initial encounter: Secondary | ICD-10-CM | POA: Diagnosis not present

## 2024-09-25 DIAGNOSIS — Y9301 Activity, walking, marching and hiking: Secondary | ICD-10-CM | POA: Diagnosis not present

## 2024-09-25 DIAGNOSIS — W19XXXA Unspecified fall, initial encounter: Secondary | ICD-10-CM

## 2024-09-25 MED ORDER — LIDOCAINE-EPINEPHRINE-TETRACAINE (LET) TOPICAL GEL
3.0000 mL | Freq: Once | TOPICAL | Status: AC
  Administered 2024-09-25: 3 mL via TOPICAL
  Filled 2024-09-25: qty 3

## 2024-09-25 MED ORDER — ACETAMINOPHEN 500 MG PO TABS
1000.0000 mg | ORAL_TABLET | Freq: Once | ORAL | Status: AC
  Administered 2024-09-25: 1000 mg via ORAL
  Filled 2024-09-25: qty 2

## 2024-09-25 MED ORDER — LIDOCAINE HCL (PF) 1 % IJ SOLN
5.0000 mL | Freq: Once | INTRAMUSCULAR | Status: AC
  Administered 2024-09-25: 5 mL
  Filled 2024-09-25: qty 5

## 2024-09-25 MED ORDER — TETANUS-DIPHTH-ACELL PERTUSSIS 5-2-15.5 LF-MCG/0.5 IM SUSP
0.5000 mL | Freq: Once | INTRAMUSCULAR | Status: AC
  Administered 2024-09-25: 0.5 mL via INTRAMUSCULAR
  Filled 2024-09-25: qty 0.5

## 2024-09-25 NOTE — ED Provider Notes (Signed)
..  Lac repair Ludie Pavlik  Date/Time: 09/25/2024 6:44 PM  Performed by: Hildegard Loge, PA-C Authorized by: Hildegard Loge, PA-C   Consent:    Consent obtained:  Verbal   Consent given by:  Patient   Risks, benefits, and alternatives were discussed: yes     Risks discussed:  Need for additional repair, infection, retained foreign body, poor cosmetic result and poor wound healing   Alternatives discussed:  No treatment Universal protocol:    Procedure explained and questions answered to patient or proxy's satisfaction: yes     Relevant documents present and verified: yes     Patient identity confirmed:  Verbally with patient and arm band Anesthesia:    Anesthesia method:  Local infiltration   Local anesthetic:  Lidocaine 1% w/o epi Laceration details:    Length (cm):  8 Pre-procedure details:    Preparation:  Patient was prepped and draped in usual sterile fashion Treatment:    Area cleansed with:  Saline and povidone-iodine   Amount of cleaning:  Standard   Irrigation solution:  Sterile saline   Irrigation volume:  500   Irrigation method:  Tap   Debridement:  None   Undermining:  None Skin repair:    Repair method:  Sutures   Suture size:  5-0   Suture material:  Prolene   Suture technique:  Simple interrupted   Number of sutures:  7 Approximation:    Approximation:  Close Repair type:    Repair type:  Simple Post-procedure details:    Procedure completion:  Tolerated well, no immediate complications     Hildegard Loge, PA-C 09/25/24 1845    Lenor Hollering, MD 09/25/24 1913

## 2024-09-25 NOTE — ED Triage Notes (Signed)
 Pt reports mechanical fall earlier today. Reports that her shoes became stuck & she fell face down on the concrete. Denies Loc, no blood thinners. Reports left knee pain, abrasions to face

## 2024-09-25 NOTE — ED Notes (Signed)
Pt is undressed 

## 2024-09-25 NOTE — Discharge Instructions (Addendum)
 Follow-up with your primary care doctor in 5 to 7 days to have your sutures removed or sooner if your symptoms are not improving.  Return to the emergency room if you have any worsening symptoms.

## 2024-09-25 NOTE — ED Provider Notes (Signed)
 [facility] AT [facility] Provider Note   CSN: [id number] Arrival date & time: 09/25/24  1541     Patient presents with: Ashley Gallegos Ashley Gallegos is a 78 y.o. female.   Patient is a 78 year old female who presents after mechanical fall.  She was walking down some stairs on her back porch and she thinks her shoe got caught on part of the step and she fell forward, hitting her head on the concrete.  She denies a loss of consciousness.  She has pain in her face and her head.  She has a worsening headache.  No nausea or vomiting.  She is not on anticoagulants.  She denies any neck or back pain.  She does have some pain in her right shoulder and her left knee.  She denies any other symptoms.  She is not sure when her last tetanus shot was.       Prior to Admission medications   Medication Sig Start Date End Date Taking? Authorizing Provider  chlorpheniramine (CHLOR-TRIMETON) 4 MG tablet Take 8 mg by mouth at bedtime. Patient not taking: Reported on 12/02/2021    [provider]  Cholecalciferol (VITAMIN D3) 25 MCG (1000 UT) CAPS 1 capsule    [provider]  dextromethorphan (DELSYM) 30 MG/5ML liquid 2 tsp every 12 hours as needed for cough Patient not taking: Reported on 12/02/2021    [provider]  famotidine (PEPCID) 20 MG tablet Take 20 mg by mouth at bedtime. Patient not taking: Reported on 12/02/2021    [provider]  ibuprofen  (ADVIL ,MOTRIN ) 200 MG tablet Take 200 mg by mouth every 6 (six) hours as needed. Patient not taking: Reported on 12/02/2021    [provider]  latanoprost (XALATAN) 0.005 % ophthalmic solution Place 1 drop into both eyes at bedtime.    [provider]  Multiple Vitamins-Minerals (MULTIVITAMIN WITH MINERALS) tablet Take 1 tablet by mouth daily.     [provider]  pantoprazole  (PROTONIX ) 40 MG tablet Take 1 tablet (40 mg total) by mouth daily. Patient not taking:  Reported on 12/02/2021 02/24/15   Darlean Ozell NOVAK, MD  predniSONE  (DELTASONE ) 10 MG tablet Take  4 each am x 2 days,   2 each am x 2 days,  1 each am x 2 days and stop Patient not taking: Reported on 12/02/2021 03/21/15   Darlean Ozell NOVAK, MD    Allergies: Omnipaque  [iohexol ], Amoxicillin, Biaxin [clarithromycin], Codeine, Darvon [propoxyphene hcl], Demerol [meperidine], and Hydrocodone    Review of Systems  Constitutional:  Negative for fever.  Respiratory:  Negative for shortness of breath.   Cardiovascular:  Negative for chest pain.  Gastrointestinal:  Negative for abdominal pain, nausea and vomiting.  Musculoskeletal:  Positive for arthralgias. Negative for back pain, joint swelling and neck pain.  Skin:  Positive for wound.  Neurological:  Positive for headaches. Negative for weakness and numbness.    Updated Vital Signs BP (!) 183/92 (BP Location: Right Arm)   Pulse (!) 101   Temp 98.1 F (36.7 C) (Oral)   Resp 18   SpO2 94%   Physical Exam Constitutional:      Appearance: She is well-developed.  HENT:     Head:     Comments: Abrasion with underlying hematoma to the forehead.  There is a 3 cm linear laceration to the forehead over the right eyebrow.  No active bleeding.  Edges are well-approximated.  4 cm V-shaped laceration to her forehead  with some small oozing    Nose:     Comments: Abrasions to the nose and the cheeks and the chin.  There is general tenderness but no obvious deformity. Eyes:     Extraocular Movements: Extraocular movements intact.     Pupils: Pupils are equal, round, and reactive to light.  Neck:     Comments: No pain along the cervical, thoracic or lumbosacral spine Cardiovascular:     Rate and Rhythm: Normal rate and regular rhythm.     Heart sounds: Normal heart sounds.  Pulmonary:     Effort: Pulmonary effort is normal. No respiratory distress.     Breath sounds: Normal breath sounds. No wheezing or rales.  Chest:     Chest wall: No tenderness.   Abdominal:     General: Bowel sounds are normal.     Palpations: Abdomen is soft.     Tenderness: There is no abdominal tenderness. There is no guarding or rebound.  Musculoskeletal:        General: Normal range of motion.     Comments: Is of tenderness on palpation of the right shoulder.  There are some tenderness on palpation of the left knee.  Mild swelling and ecchymosis to the left knee.  No pain to the hips or lower legs.  No pain to the left upper extremity.  Distal pulses are intact.  She has normal sensation and motor function in all extremities  Lymphadenopathy:     Cervical: No cervical adenopathy.  Skin:    General: Skin is warm and dry.     Findings: No rash.  Neurological:     Mental Status: She is alert and oriented to person, place, and time.     (all labs ordered are listed, but only abnormal results are displayed) Labs Reviewed - No data to display  EKG: None  Radiology: DG Knee Complete 4 Views Left Result Date: 09/25/2024 CLINICAL DATA:  Status post fall. EXAM: LEFT KNEE - COMPLETE 4+ VIEW COMPARISON:  Mar 18, 2018 FINDINGS: No evidence of fracture, dislocation, or joint effusion. There is mild dorsal patellar enthesopathy. Soft tissues are unremarkable. IMPRESSION: No acute osseous abnormality. Electronically Signed   By: Suzen Dials M.D.   On: 09/25/2024 17:52   DG Shoulder Right Result Date: 09/25/2024 CLINICAL DATA:  Status post fall. EXAM: RIGHT SHOULDER - 2+ VIEW COMPARISON:  AP thirtieth 2022 FINDINGS: There is no evidence of fracture or dislocation. There is no evidence of arthropathy or other focal bone abnormality. Soft tissues are unremarkable. IMPRESSION: Negative. Electronically Signed   By: Suzen Dials M.D.   On: 09/25/2024 17:50   CT Maxillofacial Wo Contrast Result Date: 09/25/2024 CLINICAL DATA:  Status post fall. EXAM: CT MAXILLOFACIAL WITHOUT CONTRAST TECHNIQUE: Multidetector CT imaging of the maxillofacial structures was  performed. Multiplanar CT image reconstructions were also generated. RADIATION DOSE REDUCTION: This exam was performed according to the departmental dose-optimization program which includes automated exposure control, adjustment of the mA and/or kV according to patient size and/or use of iterative reconstruction technique. COMPARISON:  None Available. FINDINGS: Osseous: No fracture or mandibular dislocation. No destructive process. Orbits: Negative. No traumatic or inflammatory finding. Sinuses: Clear. Soft tissues: There is mild right-sided facial soft tissue swelling. Moderate severity right frontal scalp soft tissue swelling is also noted. Limited intracranial: No significant or unexpected finding. IMPRESSION: 1. No acute facial bone fracture. 2. Mild right-sided facial soft tissue swelling. 3. Moderate severity right frontal scalp soft tissue swelling. Electronically Signed  By: Suzen Dials M.D.   On: 09/25/2024 17:44   CT Cervical Spine Wo Contrast Result Date: 09/25/2024 CLINICAL DATA:  Status post fall. EXAM: CT CERVICAL SPINE WITHOUT CONTRAST TECHNIQUE: Multidetector CT imaging of the cervical spine was performed without intravenous contrast. Multiplanar CT image reconstructions were also generated. RADIATION DOSE REDUCTION: This exam was performed according to the departmental dose-optimization program which includes automated exposure control, adjustment of the mA and/or kV according to patient size and/or use of iterative reconstruction technique. COMPARISON:  None Available. FINDINGS: Alignment: There is approximately 1 mm to 2 mm anterolisthesis of the C4 vertebral body on C5. Skull base and vertebrae: No acute fracture. No primary bone lesion or focal pathologic process. Soft tissues and spinal canal: No prevertebral fluid or swelling. No visible canal hematoma. Disc levels: Marked severity endplate sclerosis, mild anterior osteophyte formation and moderate severity posterior bony spurring  is seen at the levels of C5-C6 and C6-C7. There is marked severity intervertebral disc space narrowing at C5-C6 and C6-C7. Bilateral moderate to marked severity multilevel facet joint hypertrophy is noted. This is most prominent at the level of C4-C5 on the left. Upper chest: Negative. Other: None. IMPRESSION: 1. No acute fracture or traumatic subluxation. 2. Marked severity degenerative changes at the levels of C5-C6 and C6-C7. Electronically Signed   By: Suzen Dials M.D.   On: 09/25/2024 17:42   CT Head Wo Contrast Result Date: 09/25/2024 CLINICAL DATA:  Status post fall. EXAM: CT HEAD WITHOUT CONTRAST TECHNIQUE: Contiguous axial images were obtained from the base of the skull through the vertex without intravenous contrast. RADIATION DOSE REDUCTION: This exam was performed according to the departmental dose-optimization program which includes automated exposure control, adjustment of the mA and/or kV according to patient size and/or use of iterative reconstruction technique. COMPARISON:  None Available. FINDINGS: Brain: There is generalized cerebral atrophy with widening of the extra-axial spaces and ventricular dilatation. There are areas of decreased attenuation within the white matter tracts of the supratentorial brain, consistent with microvascular disease changes. Vascular: No hyperdense vessel or unexpected calcification. Skull: Normal. Negative for fracture or focal lesion. Sinuses/Orbits: No acute finding. Other: Moderate severity right frontal scalp soft tissue swelling is seen. IMPRESSION: 1. Moderate severity right frontal scalp soft tissue swelling without evidence of an acute fracture or acute intracranial abnormality. 2. Generalized cerebral atrophy and microvascular disease changes of the supratentorial brain. Electronically Signed   By: Suzen Dials M.D.   On: 09/25/2024 17:39     Procedures   Medications Ordered in the ED  Tdap (ADACEL) injection 0.5 mL (0.5 mLs  Intramuscular Given 09/25/24 1700)  acetaminophen  (TYLENOL ) tablet 1,000 mg (1,000 mg Oral Given 09/25/24 1736)  lidocaine-EPINEPHrine-tetracaine (LET) topical gel (3 mLs Topical Given 09/25/24 1906)  lidocaine (PF) (XYLOCAINE) 1 % injection 5 mL (5 mLs Infiltration Given 09/25/24 1906)                                    Medical Decision Making Amount and/or Complexity of Data Reviewed Radiology: ordered.  Risk OTC drugs. Prescription drug management.   Patient is a 78 year old female who presents after mechanical fall.  She has a wound to her forehead that is oozing.  This was repaired in the ED.  She had CT scan of her head and cervical spine as well as her maxillofacial bones that do not show any intracranial hemorrhage or fractures.  No other  acute traumatic injury.  X-rays of her left knee and right shoulder were interpreted by me and confirmed by the radiologist to show no evidence of fracture or dislocation.  Her tetanus shot was updated.  She was discharged home in good condition.  Return precautions were given.  She was given wound care instructions and advised to follow-up with her PCP in 5 to 7 days for suture removal.     Final diagnoses:  Fall, initial encounter  Forehead laceration, initial encounter  Contusion of left knee, initial encounter    ED Discharge Orders     None          Lenor Hollering, MD 09/25/24 1910

## 2024-11-26 ENCOUNTER — Emergency Department (HOSPITAL_BASED_OUTPATIENT_CLINIC_OR_DEPARTMENT_OTHER)
Admission: EM | Admit: 2024-11-26 | Discharge: 2024-11-26 | Disposition: A | Discharge status: Home / Self Care | Attending: Emergency Medicine | Admitting: Emergency Medicine

## 2024-11-26 ENCOUNTER — Emergency Department (HOSPITAL_BASED_OUTPATIENT_CLINIC_OR_DEPARTMENT_OTHER)

## 2024-11-26 ENCOUNTER — Encounter (HOSPITAL_BASED_OUTPATIENT_CLINIC_OR_DEPARTMENT_OTHER): Payer: Self-pay

## 2024-11-26 ENCOUNTER — Other Ambulatory Visit: Payer: Self-pay

## 2024-11-26 DIAGNOSIS — M19042 Primary osteoarthritis, left hand: Secondary | ICD-10-CM | POA: Diagnosis not present

## 2024-11-26 DIAGNOSIS — M79641 Pain in right hand: Secondary | ICD-10-CM | POA: Diagnosis present

## 2024-11-26 DIAGNOSIS — M19041 Primary osteoarthritis, right hand: Secondary | ICD-10-CM | POA: Diagnosis not present

## 2024-11-26 MED ORDER — PREDNISONE 20 MG PO TABS
40.0000 mg | ORAL_TABLET | Freq: Once | ORAL | Status: AC
  Administered 2024-11-26: 40 mg via ORAL
  Filled 2024-11-26: qty 2

## 2024-11-26 MED ORDER — PREDNISONE 10 MG PO TABS: ORAL_TABLET | ORAL | 0 refills | Status: AC

## 2024-11-26 NOTE — Discharge Instructions (Addendum)
 X-rays of your hand and foot showed arthritic changes in the areas where you are having pain.  You may take up to 1000mg  of tylenol  every 6 hours as needed for pain.  Do not take more then 4g per day.  You have been prescribed prednisone  which is an anti-inflammatory. Please take this medication as prescribed for the next 7 days (40mg  on days 1 and 2, 30mg  on days 3 and 4, 20mg  on days 5 and 6, 10mg  on day 7).  You were given your first dose here today.  Take your next dose tomorrow morning.  Take this medication in the morning with breakfast, as taking it at night may make it hard to sleep. If you are a diabetic, please monitor your blood sugars closely on this medication, as it can cause your blood sugar to rise.   You may also wear an over-the-counter wrist brace as needed to help with pain  Please follow-up with orthopedics Dr. Alyse if pain is not improving on its own.  Please return to the ER for any numbness in your hand, unexplained swelling or skin changes, any other new or concerning symptoms

## 2024-11-26 NOTE — ED Triage Notes (Signed)
 BIL hand pain, radiates up both forearms for multiple weeks. States unknown if related to fall back in November.

## 2024-11-26 NOTE — ED Provider Notes (Signed)
 "  [facility] Provider Note   CSN: [id number] Arrival date & time: 11/26/24  0907     Patient presents with: Hand Pain   Ashley Gallegos is a 79 y.o. female with history of carpal tunnel release of her right hand, presents with concern for bilateral hand pain ongoing for about 2 months now.  Reports that the pain has been gradually worsening.  Pain starts at the base of her thumbs and radiates up into her wrist/forearm.  Denies any numbness.  Denies any swelling in her arms or any new injuries.  Denies any pain that radiates down from her neck or upper arm.  She also reports that she developed pain to the top of her right foot that she noticed this morning.  Reports this area is tender to touch.  Denies any injuries.  No numbness, leg swelling.  No history of a DVT.  No recent surgeries, hospitalizations, or recent long plane or car rides.   HPI     Prior to Admission medications  Medication Sig Start Date End Date Taking? Authorizing Provider  predniSONE  (DELTASONE ) 10 MG tablet Take 4 tablets (40 mg total) by mouth daily with breakfast for 1 day, THEN 3 tablets (30 mg total) daily with breakfast for 2 days, THEN 2 tablets (20 mg total) daily with breakfast for 2 days, THEN 1 tablet (10 mg total) daily with breakfast for 1 day. 11/27/24 12/03/24 Yes Veta Palma, PA-C  chlorpheniramine (CHLOR-TRIMETON) 4 MG tablet Take 8 mg by mouth at bedtime. Patient not taking: Reported on 12/02/2021    [provider]  Cholecalciferol (VITAMIN D3) 25 MCG (1000 UT) CAPS 1 capsule    [provider]  dextromethorphan (DELSYM) 30 MG/5ML liquid 2 tsp every 12 hours as needed for cough Patient not taking: Reported on 12/02/2021    [provider]  famotidine (PEPCID) 20 MG tablet Take 20 mg by mouth at bedtime. Patient not taking: Reported on 12/02/2021    [provider]  ibuprofen  (ADVIL ,MOTRIN ) 200 MG tablet Take 200 mg by  mouth every 6 (six) hours as needed. Patient not taking: Reported on 12/02/2021    [provider]  latanoprost (XALATAN) 0.005 % ophthalmic solution Place 1 drop into both eyes at bedtime.    [provider]  Multiple Vitamins-Minerals (MULTIVITAMIN WITH MINERALS) tablet Take 1 tablet by mouth daily.     [provider]  pantoprazole  (PROTONIX ) 40 MG tablet Take 1 tablet (40 mg total) by mouth daily. Patient not taking: Reported on 12/02/2021 02/24/15   Darlean Ozell NOVAK, MD    Allergies: Omnipaque  [iohexol ], Amoxicillin, Biaxin [clarithromycin], Codeine, Darvon [propoxyphene hcl], Demerol [meperidine], and Hydrocodone    Review of Systems  Musculoskeletal:        Hand pain    Updated Vital Signs BP (!) 165/74   Pulse 88   Temp 98.1 F (36.7 C) (Oral)   Resp 18   SpO2 97%   Physical Exam Vitals and nursing note reviewed.  Constitutional:      Appearance: Normal appearance.  HENT:     Head: Atraumatic.  Cardiovascular:     Rate and Rhythm: Normal rate and regular rhythm.     Comments: 2+ radial and pedal pulsesbilaterally Pulmonary:     Effort: Pulmonary effort is normal.  Musculoskeletal:     Comments: Right upper extremity: General No erythema, edema, contusions, open wounds   Palpation Tender over the distal aspect of the first  MCP.  Nontender of the carpal bones diffusely, no snuffbox TTP Nontender over the 2nd through 5th metacarpals, 1st through 5th phalanges Nontender over the radius or ulna  ROM Full flexion extension at the wrist Full flexion extension at the 1st through 5th MCPs, PIPs, DIPs  Special tests Pain reproduced with Finkelstein's on the right hand   Left upper extremity: General No erythema, edema, contusions, open wounds   Palpation Nontender of the carpal bones diffusely, no snuffbox TTP Nontender over the 1st through 5th metacarpals, 1st through 5th phalanges Nontender over the radius or ulna  ROM Full flexion  extension at the wrist Full flexion extension at the 1st through 5th MCPs, PIPs, DIPs  Special tests Not as much pain with Finkelstein's as compared to right hand   Right lower extremity: General No erythema, edema, contusions, open wounds   Palpation Tender to the base of the 2nd metatarsal Nontender along the tibia and fibula Non-tender along the ATFL, CFL, PTFL, achilles tendon Nontender along the first, 3rd, 4th, 5th metatarsals and 1st through 5th phalanges  ROM Full ankle flexion, extension, inversion and eversion  Sensation: Sensation intact throughout the lower extremity   Neurological:     General: No focal deficit present.     Mental Status: She is alert.     Comments: 5/5 strength with resisted finger abduction, thumb and index finger opposition, and wrist extension (intact radian, median, ulnar nerve testing)  Intact sensation to the 1st through 5th digits of the right and left hand  Psychiatric:        Mood and Affect: Mood normal.        Behavior: Behavior normal.     (all labs ordered are listed, but only abnormal results are displayed) Labs Reviewed - No data to display  EKG: None  Radiology: DG Foot Complete Right Result Date: 11/26/2024 CLINICAL DATA:  Dorsal foot pain. EXAM: RIGHT FOOT COMPLETE - 3+ VIEW COMPARISON:  None Available. FINDINGS: Minimal first metatarsophalangeal joint osteoarthritis. No acute osseous or joint abnormality. IMPRESSION: 1. No acute findings. 2. Minimal first metatarsophalangeal joint osteoarthritis. Electronically Signed   By: Newell Eke M.D.   On: 11/26/2024 10:16   DG Hand Complete Left Result Date: 11/26/2024 CLINICAL DATA:  Bilateral hand pain, thumb pain. EXAM: LEFT HAND - COMPLETE 3+ VIEW COMPARISON:  None Available. FINDINGS: Slight lateral subluxation of the first metacarpal with mild sclerosis of the first carpometacarpal joint. No additional degenerative changes in the fingers. No acute osseous or joint  abnormality. Old ulnar styloid avulsion fracture. IMPRESSION: First carpometacarpal joint osteoarthritis. Electronically Signed   By: Newell Eke M.D.   On: 11/26/2024 10:15   DG Hand Complete Right Result Date: 11/26/2024 CLINICAL DATA:  Bilateral hand pain for several weeks. Swelling at the right thumb. EXAM: RIGHT HAND - COMPLETE 3+ VIEW COMPARISON:  None Available. FINDINGS: No acute osseous or joint abnormality. No erosions or joint subluxations. Mild sclerosis at the first carpometacarpal joint. IMPRESSION: 1. No acute findings. 2. First CMC joint osteoarthritis. Electronically Signed   By: Newell Eke M.D.   On: 11/26/2024 10:14     Procedures   Medications Ordered in the ED  predniSONE  (DELTASONE ) tablet 40 mg (has no administration in time range)    Clinical Course as of 11/26/24 1035  Mon Nov 26, 2024  0927 Patient declines any pain medicine at this time [AF]    Clinical Course User Index [AF] Veta Palma, PA-C  Medical Decision Making Amount and/or Complexity of Data Reviewed Radiology: ordered.  Risk Prescription drug management.    Differential diagnosis includes but is not limited to de Quervain's tenosynovitis, fracture, dislocation, sprain, strain, contusion, laceration, nerve injury, vascular injury, compartment syndrome  ED Course:  Upon initial evaluation, patient is well-appearing, no acute distress.  Patient has reproducible tenderness to the distal aspect of the first MCP in the right hand.  Reports this is also where she is having pain in the left hand, but less tender to palpation.  Also tender over the base of the 1st and 2nd metatarsal on the right foot.  No known injuries.  She is neurovascularly intact in the upper or lower extremities.  No erythema, edema, or difficulty with range of motion to suggest gout, septic arthritis, cellulitis.  No edema in the upper or lower extremities to suggest DVT.   Imaging  Studies ordered: I ordered imaging studies including x-ray right hand, left hand, right foot I independently visualized the imaging with scope of interpretation limited to determining acute life threatening conditions related to emergency care. Imaging showed  Right foot: IMPRESSION:  1. No acute findings.  2. Minimal first metatarsophalangeal joint osteoarthritis.   Right hand: IMPRESSION:  1. No acute findings.  2. First CMC joint osteoarthritis.   Left hand: IMPRESSION:  First carpometacarpal joint osteoarthritis.   I agree with the radiologist interpretation  Medications Given: Prednisone   Imaging was reviewed which revealed right and left first [facility] joint osteoarthritis as well as osteoarthritis at the first metatarsophalangeal joint.  This corresponds with the patient's area of pain.  My attending Dr. Midge also personally saw and evaluated patient and recommends treatment with course of prednisone  for patient's arthritis flare.  Patient stable and appropriate for discharge home.     Impression: Arthritis of the right and left hand Arthritis right foot  Disposition:  Patient discharged home with instructions to take Tylenol  as needed for pain.  Prednisone  taper as prescribed.  Follow-up with Dr. Alyse with hand orthopedics if pain not improving Return precautions given and patient verbalized understanding.    This chart was dictated using voice recognition software, Dragon. Despite the best efforts of this provider to proofread and correct errors, errors may still occur which can change documentation meaning.       Final diagnoses:  Arthritis of both hands    ED Discharge Orders          Ordered    predniSONE  (DELTASONE ) 10 MG tablet  Q breakfast        11/26/24 1030               Veta Palma, PA-C 11/26/24 1035    Midge Golas, MD 11/26/24 1055  "
